# Patient Record
Sex: Male | Born: 1970
Health system: Southern US, Community
[De-identification: ages and names within clinical notes are randomized; demographics above are authoritative.]

## PROBLEM LIST (undated history)

## (undated) DIAGNOSIS — K219 Gastro-esophageal reflux disease without esophagitis: Secondary | ICD-10-CM

## (undated) HISTORY — PX: HERNIA REPAIR: SHX51

## (undated) HISTORY — PX: CHOLECYSTECTOMY: SHX55

## (undated) HISTORY — DX: Gastro-esophageal reflux disease without esophagitis: K21.9

---

## 2002-07-24 ENCOUNTER — Encounter: Payer: Self-pay | Admitting: *Deleted

## 2002-07-24 ENCOUNTER — Encounter (INDEPENDENT_AMBULATORY_CARE_PROVIDER_SITE_OTHER): Payer: Self-pay | Admitting: *Deleted

## 2002-07-24 ENCOUNTER — Observation Stay (HOSPITAL_COMMUNITY): Admission: RE | Admit: 2002-07-24 | Discharge: 2002-07-25 | Payer: Self-pay | Admitting: *Deleted

## 2004-10-26 ENCOUNTER — Ambulatory Visit: Payer: Self-pay | Admitting: Family Medicine

## 2005-09-14 ENCOUNTER — Ambulatory Visit: Payer: Self-pay | Admitting: Family Medicine

## 2010-06-10 ENCOUNTER — Emergency Department (HOSPITAL_COMMUNITY): Admission: EM | Admit: 2010-06-10 | Discharge: 2010-06-10 | Payer: Self-pay | Admitting: Emergency Medicine

## 2011-01-05 LAB — URINALYSIS, ROUTINE W REFLEX MICROSCOPIC
Bilirubin Urine: NEGATIVE
Glucose, UA: NEGATIVE mg/dL
Ketones, ur: NEGATIVE mg/dL
Leukocytes, UA: NEGATIVE
Nitrite: NEGATIVE
Protein, ur: NEGATIVE mg/dL
Specific Gravity, Urine: <1.005 — ABNORMAL LOW (ref 1.005–1.030)
Urobilinogen, UA: 0.2 mg/dL (ref 0.0–1.0)
pH: 6.5 (ref 5.0–8.0)

## 2011-01-05 LAB — URINE CULTURE
Colony Count: NO GROWTH
Culture  Setup Time: 201108202100
Culture: NO GROWTH

## 2011-01-05 LAB — URINE MICROSCOPIC-ADD ON

## 2011-03-09 NOTE — Op Note (Signed)
NAME:  Daniel Ellis, Daniel Ellis                           ACCOUNT NO.:  [id number]   MEDICAL RECORD NO.:  [id number]                   PATIENT TYPE:  AMB   LOCATION:  DAY                                  FACILITY:  [facility]   PHYSICIAN:  Thomas B. Samuella Cota, M.D.               DATE OF BIRTH:  1971-02-13   DATE OF PROCEDURE:  07/24/2002  DATE OF DISCHARGE:                                 OPERATIVE REPORT   OFFICE MEDICAL RECORD NUMBER:  [id number].   PREOPERATIVE DIAGNOSIS:  Biliary dyskinesia.   POSTOPERATIVE DIAGNOSIS:  Biliary dyskinesia.   OPERATION:  Laparoscopic cholecystectomy with operative cholangiogram.   SURGEON:  Maisie Fus B. Samuella Cota, M.D.   ASSISTANT:  Gabrielle Dare. Janee Morn, M.D.   ANESTHESIA:  General.   ANESTHESIOLOGIST:  CRNA.   DESCRIPTION OF PROCEDURE:  The patient was taken to the operating room and  placed on the table in Ellis supine position.  After satisfactory general  anesthetic with intubation, the entire abdomen was prepped and draped as Ellis  sterile field.  Ellis small vertical infraumbilical incision was made through  skin and subcutaneous tissue and midline fascia.  Ellis finger was placed into  the peritoneal cavity, and there were no adhesions.  Ellis pursestring suture of  0 Vicryl was placed, and the Hasson trocar was placed into the abdomen and  the abdomen insufflated to 14 mmHg pressure.  Ellis second 10 mm trocar was  placed just to the right of midline and subxiphoid area.  Two 5 mm trocars  were placed laterally.  Gallbladder was placed on some traction.  There were  adhesions to the gallbladder, extending up about one-third the length of the  gallbladder.  They were taken down with gentle dissection.  The cystic  artery was readily identified and triply clipped on the remaining side, once  on the gallbladder side.  The cystic duct was also readily identified, was  fairly long, and dissected free, and Ellis clip was placed on the gallbladder  side of the cystic duct.  Ellis Cook Cholangiocath  catheter was then introduced  through anterior abdominal wall.  Ellis small opening was made into the cystic  duct, and the Cholangiocath was placed into the cystic duct and held in  place with an Endo Clip.  Using real-time fluoroscopy and the C-arm, Ellis  cholangiogram was carried out.  This revealed good filling of the entire  biliary system with free spillage of the contents into the duodenum.  The  cystic duct seemed to be coming into the junction of the right and left  hepatic ducts.  The operative cholangiogram had been reviewed.  The  Cholangiocath was removed, and the cystic duct was triply clipped on the  remaining side.  The cystic duct was then divided, and the cystic artery was  also then divided.  The gallbladder was then dissected from the bed using  the cautery.  There was another vessel coming off posteriorly which was  doubly clipped.  Gallbladder was freed up and hemostasis obtained.  The  gallbladder was easily removed through the infraumbilical incision.  The  right upper quadrant was irrigated, and there was no evidence of any blood  or bile.  The two lateral trocars were removed under direct vision.  The  pursestring suture at the infraumbilical incision was tied to give Ellis good  closure of the fascia.  It was then injected with 0.25% Marcaine without  epinephrine.  The other sites had previously been injected.  The abdomen was  deflated and the second 10 mm trocar removed.  The two midline incisions  were closed with running subcutaneous  sutures of 40 Vicryl, and the lateral trocar sites were closed with single  simple 4-0 Vicryl subcuticular sutures.  Benzoin and quarter-inch Steri-  Strips were used to reinforce the skin closure.  Dry sterile dressing was  applied.  The patient seemed to tolerate the procedure well and was taken to  the PACU in satisfactory condition.                                               Thomas B. Samuella Cota, M.D.    TBP/MEDQ  D:  07/24/2002  T:   07/24/2002  Job:  [id number]   cc:   Delaney Meigs, M.D.  [address]  [place]  [place] [address]  Fax: [phone]

## 2013-08-11 ENCOUNTER — Encounter (INDEPENDENT_AMBULATORY_CARE_PROVIDER_SITE_OTHER): Payer: Self-pay

## 2013-08-11 ENCOUNTER — Ambulatory Visit (INDEPENDENT_AMBULATORY_CARE_PROVIDER_SITE_OTHER): Payer: BC Managed Care – PPO | Admitting: Family Medicine

## 2013-08-11 VITALS — BP 123/80 | HR 67 | Temp 98.3°F | Wt 237.0 lb

## 2013-08-11 DIAGNOSIS — H6690 Otitis media, unspecified, unspecified ear: Secondary | ICD-10-CM

## 2013-08-11 DIAGNOSIS — H60399 Other infective otitis externa, unspecified ear: Secondary | ICD-10-CM

## 2013-08-11 DIAGNOSIS — H6092 Unspecified otitis externa, left ear: Secondary | ICD-10-CM

## 2013-08-11 MED ORDER — AMOXICILLIN 875 MG PO TABS: 875.0000 mg | ORAL_TABLET | Freq: Two times a day (BID) | ORAL | Status: DC

## 2013-08-11 MED ORDER — NEOMYCIN-POLYMYXIN-HC 3.5-10000-1 OT SOLN: 3.0000 [drp] | Freq: Four times a day (QID) | OTIC | Status: DC

## 2013-08-11 NOTE — Progress Notes (Signed)
  Subjective:    Patient ID: Daniel Ellis, male    DOB: Nov 19, 1970, 42 y.o.   MRN: [id number]  HPI EAR PAIN Location:  L ear  Description: L ear pain, sensation of fullness Onset:  2-3 days  Modifying factors: none   Symptoms  Sensation of fullness: yes  Ear discharge: no URI symptoms: no  Fever: no Tinnitus:no   Dizziness:no   Hearing loss:no   Toothache: no Rashes or lesions: no Facial muscle weakness: no  Red Flags Recent trauma: no PMH prior ear surgery:  Yes. ET tubes as a child  Diabetes or Immunosuppresion: no      Review of Systems  All other systems reviewed and are negative.       Objective:   Physical Exam  Constitutional: He appears well-developed and well-nourished.  HENT:  Head: Normocephalic and atraumatic.  Right Ear: External ear normal.  L ear canal erythema and tenderness to otoscopic evaluaton    Eyes: Conjunctivae are normal. Pupils are equal, round, and reactive to light.  Neck: Normal range of motion.  Cardiovascular: Normal rate and regular rhythm.   Pulmonary/Chest: Effort normal.  Abdominal: Soft.  Musculoskeletal: Normal range of motion.  Neurological: He is alert.  Skin: Skin is warm.          Assessment & Plan:  OE (otitis externa), left - Plan: neomycin-polymyxin-hydrocortisone (CORTISPORIN) otic solution  AOM (acute otitis media), left - Plan: amoxicillin (AMOXIL) 875 MG tablet  WIll treat with cortisporin otic and amox  Discussed infectious and ENT red flags.  Pt also had some issues with ? Sexual hyperexcitability as I was leaving the room. Him and his wife have decreased sexual frequency s/p child. No problems with erections. Discussed increasing sexual frequency, diet and multivitamin use. Follow up if not improved with increased sexual frequency. Start SSRI vs. Tramadol.  Follow up as needed.

## 2013-10-19 ENCOUNTER — Ambulatory Visit (INDEPENDENT_AMBULATORY_CARE_PROVIDER_SITE_OTHER): Payer: BC Managed Care – PPO | Admitting: Family Medicine

## 2013-10-19 ENCOUNTER — Encounter: Payer: Self-pay | Admitting: Family Medicine

## 2013-10-19 ENCOUNTER — Encounter (INDEPENDENT_AMBULATORY_CARE_PROVIDER_SITE_OTHER): Payer: Self-pay

## 2013-10-19 VITALS — BP 111/73 | HR 73 | Temp 98.0°F | Ht 72.0 in | Wt 231.0 lb

## 2013-10-19 DIAGNOSIS — J309 Allergic rhinitis, unspecified: Secondary | ICD-10-CM

## 2013-10-19 MED ORDER — LORATADINE 10 MG PO TABS: 10.0000 mg | ORAL_TABLET | Freq: Every day | ORAL | Status: DC

## 2013-10-19 MED ORDER — FLUTICASONE PROPIONATE 50 MCG/ACT NA SUSP: 2.0000 | Freq: Every day | NASAL | Status: DC

## 2013-10-19 NOTE — Progress Notes (Signed)
   Subjective:    Patient ID: Daniel Ellis, male    DOB: 11-10-1970, 42 y.o.   MRN: [id number]  HPI EAR PAIN Location:  L ear  Description: intermittent ear discomfort, minimal pain  Onset:  2-3 days  Modifying factors: was treated for EO and AOM about 1 month ago. Resolved. Now with sinus pressure. Hx/o sinus pressure/allergic rhinitis partially treated   Symptoms  Sensation of fullness: no Ear discharge: minimal  URI symptoms: mild  Fever: no Tinnitus:no   Dizziness:no   Hearing loss:no   Toothache: no Rashes or lesions: no Facial muscle weakness: no  Red Flags Recent trauma: no PMH prior ear surgery:  ET tubes in childhood.  Diabetes or Immunosuppresion: no       Review of Systems  All other systems reviewed and are negative.       Objective:   Physical Exam  Constitutional: He is oriented to person, place, and time. He appears well-developed and well-nourished.  HENT:  Head: Normocephalic and atraumatic.  Right Ear: External ear normal.  Left Ear: External ear normal.  Eyes: Conjunctivae are normal. Pupils are equal, round, and reactive to light.  Neck: Normal range of motion.  Cardiovascular: Normal rate and regular rhythm.   Pulmonary/Chest: Effort normal and breath sounds normal.  Abdominal: Soft.  Musculoskeletal: Normal range of motion.  Neurological: He is alert and oriented to person, place, and time.  Skin: Skin is warm.          Assessment & Plan:  Allergic rhinitis - Plan: fluticasone (FLONASE) 50 MCG/ACT nasal spray, loratadine (CLARITIN) 10 MG tablet  Exam and history consistent with allergic rhinitis and secondary eustachian tube dysfunction.  Will start pt on flonase and claritin.  Outer/inner ear pathology on exam Discussed general and ENT red flags at length.  Consider ENT follow up if sxs persist despite treatment.  Follow up as needed.

## 2014-03-10 ENCOUNTER — Encounter (INDEPENDENT_AMBULATORY_CARE_PROVIDER_SITE_OTHER): Payer: BC Managed Care – PPO | Admitting: Ophthalmology

## 2014-03-10 DIAGNOSIS — H3400 Transient retinal artery occlusion, unspecified eye: Secondary | ICD-10-CM

## 2014-03-10 DIAGNOSIS — H2510 Age-related nuclear cataract, unspecified eye: Secondary | ICD-10-CM

## 2014-03-10 DIAGNOSIS — H43819 Vitreous degeneration, unspecified eye: Secondary | ICD-10-CM

## 2014-11-29 ENCOUNTER — Ambulatory Visit (INDEPENDENT_AMBULATORY_CARE_PROVIDER_SITE_OTHER): Payer: BLUE CROSS/BLUE SHIELD | Admitting: Family Medicine

## 2014-11-29 ENCOUNTER — Encounter: Payer: Self-pay | Admitting: Family Medicine

## 2014-11-29 VITALS — BP 131/87 | HR 92 | Temp 97.6°F | Ht 72.0 in | Wt 233.0 lb

## 2014-11-29 DIAGNOSIS — Z024 Encounter for examination for driving license: Secondary | ICD-10-CM

## 2014-11-29 NOTE — Progress Notes (Signed)
   Subjective:    Patient ID: Daniel Ellis, male    DOB: 12-24-1970, 44 y.o.   MRN: [id number]  HPI Patient is here for DOT PE  Review of Systems  Constitutional: Negative for fever.  HENT: Negative for ear pain.   Eyes: Negative for discharge.  Respiratory: Negative for cough.   Cardiovascular: Negative for chest pain.  Gastrointestinal: Negative for abdominal distention.  Endocrine: Negative for polyuria.  Genitourinary: Negative for difficulty urinating.  Musculoskeletal: Negative for gait problem and neck pain.  Skin: Negative for color change and rash.  Neurological: Negative for speech difficulty and headaches.  Psychiatric/Behavioral: Negative for agitation.       Objective:    BP 131/87 mmHg  Pulse 92  Temp(Src) 97.6 F (36.4 C) (Oral)  Ht 6' (1.829 m)  Wt 233 lb (105.688 kg)  BMI 31.59 kg/m2 Physical Exam  Constitutional: He is oriented to person, place, and time. He appears well-developed and well-nourished.  HENT:  Head: Normocephalic and atraumatic.  Mouth/Throat: Oropharynx is clear and moist.  Eyes: Pupils are equal, round, and reactive to light.  Neck: Normal range of motion. Neck supple.  Cardiovascular: Normal rate and regular rhythm.   No murmur heard. Pulmonary/Chest: Effort normal and breath sounds normal.  Abdominal: Soft. Bowel sounds are normal. There is no tenderness.  Neurological: He is alert and oriented to person, place, and time.  Skin: Skin is warm and dry.  Psychiatric: He has a normal mood and affect.          Assessment & Plan:     ICD-9-CM ICD-10-CM   1. Encounter for CDL (commercial driving license) exam W29.5V70.5 Z02.1      No Follow-up on file.  Deatra CanterWilliam J Coreon Simkins FNP

## 2015-02-01 ENCOUNTER — Encounter: Payer: Self-pay | Admitting: Nurse Practitioner

## 2015-02-01 ENCOUNTER — Telehealth: Payer: Self-pay | Admitting: Family Medicine

## 2015-02-01 ENCOUNTER — Ambulatory Visit (INDEPENDENT_AMBULATORY_CARE_PROVIDER_SITE_OTHER): Payer: BLUE CROSS/BLUE SHIELD | Admitting: Nurse Practitioner

## 2015-02-01 VITALS — BP 120/82 | HR 92 | Temp 98.0°F | Ht 72.0 in | Wt 229.2 lb

## 2015-02-01 DIAGNOSIS — R197 Diarrhea, unspecified: Secondary | ICD-10-CM

## 2015-02-01 NOTE — Patient Instructions (Signed)

## 2015-02-01 NOTE — Progress Notes (Signed)
  Subjective:     Daniel Ellis is a 44 y.o. male who presents for evaluation of diarrhea 5 times per day. Symptoms have been present for 3 weeks. Patient denies blood in stool and nausea. Patient's oral intake has been normal. He has tried imodium AD OTC which works but as soon as he stops taking diarrhea returns. Says that it always has a greenish color. Patient's urine output has been adequate. Other contacts with similar symptoms include: none. Patient denies recent travel history. Patient has not had recent ingestion of possible contaminated food, toxic plants, or inappropriate medications/poisons.   The following portions of the patient's history were reviewed and updated as appropriate: allergies, current medications, past family history, past medical history, past social history, past surgical history and problem list.  Review of Systems Pertinent items are noted in HPI.    Objective:     BP 120/82 mmHg  Pulse 92  Temp(Src) 98 F (36.7 C) (Oral)  Ht 6' (1.829 m)  Wt 229 lb 3.2 oz (103.964 kg)  BMI 31.08 kg/m2 General appearance: alert, cooperative, appears stated age and no distress Lungs: clear to auscultation bilaterally Heart: regular rate and rhythm, S1, S2 normal, no murmur, click, rub or gallop Abdomen: soft, non-tender; bowel sounds normal; no masses,  no organomegaly    Assessment:   Diarrhea       Plan:    1. Discussed oral rehydration, reintroduction of solid foods, signs of dehydration. 2. Return or go to emergency department if worsening symptoms, blood or bile, signs of dehydration, diarrhea lasting longer than 5 days or any new concerns. Stool specimens given to patient. 3. Follow up in 7 days or sooner as needed.    Orders Placed This Encounter  Procedures  . Cdiff NAA+O+P+Stool Culture    Standing Status: Future     Number of Occurrences:      Standing Expiration Date: 02/01/2016    Mary-Margaret Daphine DeutscherMartin, FNP

## 2015-02-01 NOTE — Telephone Encounter (Signed)
Appointment given for tonight with Paulene FloorMary Martin, FNP.

## 2015-02-04 ENCOUNTER — Other Ambulatory Visit: Payer: BLUE CROSS/BLUE SHIELD

## 2015-02-04 NOTE — Addendum Note (Signed)
Addended by: Orma RenderHODGES, Christain Niznik F on: 02/04/2015 12:00 PM   Modules accepted: Orders

## 2015-02-08 LAB — CDIFF NAA+O+P+STOOL CULTURE
E coli, Shiga toxin Assay: NEGATIVE
Toxigenic C. Difficile by PCR: NEGATIVE

## 2015-05-06 ENCOUNTER — Other Ambulatory Visit: Payer: Self-pay | Admitting: Orthopedic Surgery

## 2017-11-28 ENCOUNTER — Ambulatory Visit (INDEPENDENT_AMBULATORY_CARE_PROVIDER_SITE_OTHER): Payer: BLUE CROSS/BLUE SHIELD | Admitting: Family Medicine

## 2017-11-28 ENCOUNTER — Encounter: Payer: Self-pay | Admitting: Family Medicine

## 2017-11-28 VITALS — BP 112/79 | HR 88 | Temp 99.3°F | Ht 72.0 in | Wt 228.0 lb

## 2017-11-28 DIAGNOSIS — J0100 Acute maxillary sinusitis, unspecified: Secondary | ICD-10-CM

## 2017-11-28 DIAGNOSIS — H66002 Acute suppurative otitis media without spontaneous rupture of ear drum, left ear: Secondary | ICD-10-CM

## 2017-11-28 MED ORDER — FLUTICASONE PROPIONATE 50 MCG/ACT NA SUSP: 1.0000 | Freq: Two times a day (BID) | NASAL | 6 refills | Status: DC | PRN

## 2017-11-28 MED ORDER — AZITHROMYCIN 250 MG PO TABS: ORAL_TABLET | ORAL | 0 refills | Status: DC

## 2017-11-28 NOTE — Progress Notes (Signed)
BP 112/79   Pulse 88   Temp 99.3 F (37.4 C) (Oral)   Ht 6' (1.829 m)   Wt 228 lb (103.4 kg)   BMI 30.92 kg/m    Subjective:    Patient ID: Daniel Ellis, male    DOB: Jul 31, 1971, 47 y.o.   MRN: [id number]  HPI: Daniel Ellis is a 47 y.o. male presenting on 11/28/2017 for Sinus congestion, left ear stopped up, nose bleeding, teeth    HPI Sinus congestion and left ear pain and teeth pain Patient has been having sinus congestion and pain in his face and in his teeth and left ear pressure that is been going on for the past week but really worsened over the past day.  He says is been using Alka-Seltzer over-the-counter stuff that has not been working.  He also says is been having some popping and pressure in his left ear as well.  He denies any fevers or chills or shortness of breath or wheezing  Relevant past medical, surgical, family and social history reviewed and updated as indicated. Interim medical history since our last visit reviewed. Allergies and medications reviewed and updated.  Review of Systems  Constitutional: Negative for chills and fever.  HENT: Positive for congestion, ear pain, postnasal drip, rhinorrhea, sinus pressure and sore throat. Negative for ear discharge, sneezing and voice change.   Eyes: Negative for pain, discharge, redness and visual disturbance.  Respiratory: Positive for cough. Negative for shortness of breath and wheezing.   Cardiovascular: Negative for chest pain and leg swelling.  Musculoskeletal: Negative for gait problem.  Skin: Negative for rash.  All other systems reviewed and are negative.   Per HPI unless specifically indicated above        Objective:    BP 112/79   Pulse 88   Temp 99.3 F (37.4 C) (Oral)   Ht 6' (1.829 m)   Wt 228 lb (103.4 kg)   BMI 30.92 kg/m   Wt Readings from Last 3 Encounters:  11/28/17 228 lb (103.4 kg)  02/01/15 229 lb 3.2 oz (104 kg)  11/29/14 233 lb (105.7 kg)    Physical Exam  Constitutional:  He is oriented to person, place, and time. He appears well-developed and well-nourished. No distress.  HENT:  Right Ear: Tympanic membrane, external ear and ear canal normal.  Left Ear: External ear and ear canal normal. No drainage or swelling. Tympanic membrane is injected and erythematous. Tympanic membrane is not perforated and not retracted. Tympanic membrane mobility is normal. A middle ear effusion is present.  Nose: Mucosal edema and rhinorrhea present. No sinus tenderness. No epistaxis. Right sinus exhibits maxillary sinus tenderness. Right sinus exhibits no frontal sinus tenderness. Left sinus exhibits maxillary sinus tenderness. Left sinus exhibits no frontal sinus tenderness.  Mouth/Throat: Uvula is midline and mucous membranes are normal. Posterior oropharyngeal edema and posterior oropharyngeal erythema present. No oropharyngeal exudate or tonsillar abscesses.  Eyes: Conjunctivae and EOM are normal. Pupils are equal, round, and reactive to light. No scleral icterus.  Neck: Neck supple. No thyromegaly present.  Cardiovascular: Normal rate, regular rhythm, normal heart sounds and intact distal pulses.  No murmur heard. Pulmonary/Chest: Effort normal and breath sounds normal. No respiratory distress. He has no wheezes. He has no rales.  Musculoskeletal: Normal range of motion. He exhibits no edema.  Lymphadenopathy:    He has no cervical adenopathy.  Neurological: He is alert and oriented to person, place, and time. Coordination normal.  Skin: Skin is warm  and dry. No rash noted. He is not diaphoretic.  Psychiatric: He has a normal mood and affect. His behavior is normal.  Nursing note and vitals reviewed.      Assessment & Plan:   Problem List Items Addressed This Visit    None    Visit Diagnoses    Acute non-recurrent maxillary sinusitis    -  Primary   Relevant Medications   azithromycin (ZITHROMAX) 250 MG tablet   fluticasone (FLONASE) 50 MCG/ACT nasal spray   Acute  suppurative otitis media of left ear without spontaneous rupture of tympanic membrane, recurrence not specified       Relevant Medications   azithromycin (ZITHROMAX) 250 MG tablet   fluticasone (FLONASE) 50 MCG/ACT nasal spray       Follow up plan: Return if symptoms worsen or fail to improve.  Counseling provided for all of the vaccine components No orders of the defined types were placed in this encounter.   Arville CareJoshua Jahniah Pallas, MD [facility] 11/28/2017, 10:11 AM

## 2017-12-07 ENCOUNTER — Encounter: Payer: Self-pay | Admitting: Family Medicine

## 2017-12-07 ENCOUNTER — Ambulatory Visit (INDEPENDENT_AMBULATORY_CARE_PROVIDER_SITE_OTHER): Payer: BLUE CROSS/BLUE SHIELD | Admitting: Family Medicine

## 2017-12-07 VITALS — BP 123/81 | HR 74 | Temp 97.7°F | Ht 72.0 in | Wt 229.0 lb

## 2017-12-07 DIAGNOSIS — H66005 Acute suppurative otitis media without spontaneous rupture of ear drum, recurrent, left ear: Secondary | ICD-10-CM | POA: Diagnosis not present

## 2017-12-07 DIAGNOSIS — J0100 Acute maxillary sinusitis, unspecified: Secondary | ICD-10-CM | POA: Diagnosis not present

## 2017-12-07 MED ORDER — AMOXICILLIN-POT CLAVULANATE 875-125 MG PO TABS: 1.0000 | ORAL_TABLET | Freq: Two times a day (BID) | ORAL | 0 refills | Status: DC

## 2017-12-07 MED ORDER — PREDNISONE 20 MG PO TABS: ORAL_TABLET | ORAL | 0 refills | Status: DC

## 2017-12-07 NOTE — Progress Notes (Signed)
BP 123/81   Pulse 74   Temp 97.7 F (36.5 C) (Oral)   Ht 6' (1.829 m)   Wt 229 lb (103.9 kg)   BMI 31.06 kg/m    Subjective:    Patient ID: Daniel Ellis, male    DOB: [birth year], 47 y.o.   MRN: [id number]  HPI: Daniel Frosted A Piasecki is a 47 y.o. male presenting on 12/07/2017 for sinus congestion, blood in nose, left ear stopped up, headac (x 2 weeks, Zpak did not get rid of it)   HPI Congestion and cough and blood in his nose and left ear stopped Patient comes in with continued congestion and cough and blood in his nose and left ear stopped up been going on for the past 2 weeks he took the Z-Pak and has been using the Flonase and it has not improved.  He says previously amoxicillin has worked well for him and he would like to try it with that.  He denies any fevers or chills but does have headaches and especially pain extending from his left ear and then also sinus pressure in his face as well.  He denies any sick contacts that he knows of.  He is personally not a smoker but he is around his wife who smokes.  Relevant past medical, surgical, family and social history reviewed and updated as indicated. Interim medical history since our last visit reviewed. Allergies and medications reviewed and updated.  Review of Systems  Constitutional: Negative for chills and fever.  HENT: Positive for congestion, ear pain, postnasal drip, rhinorrhea, sinus pressure, sneezing and sore throat. Negative for ear discharge and voice change.   Eyes: Negative for pain, discharge, redness and visual disturbance.  Respiratory: Positive for cough. Negative for shortness of breath and wheezing.   Cardiovascular: Negative for chest pain and leg swelling.  Musculoskeletal: Negative for gait problem.  Skin: Negative for rash.  All other systems reviewed and are negative.   Per HPI unless specifically indicated above        Objective:    BP 123/81   Pulse 74   Temp 97.7 F (36.5 C) (Oral)   Ht 6' (1.829  m)   Wt 229 lb (103.9 kg)   BMI 31.06 kg/m   Wt Readings from Last 3 Encounters:  12/07/17 229 lb (103.9 kg)  11/28/17 228 lb (103.4 kg)  02/01/15 229 lb 3.2 oz (104 kg)    Physical Exam  Constitutional: He is oriented to person, place, and time. He appears well-developed and well-nourished. No distress.  HENT:  Right Ear: Tympanic membrane, external ear and ear canal normal.  Left Ear: Ear canal normal. No drainage or swelling. Tympanic membrane is injected, erythematous and bulging.  Nose: Mucosal edema and rhinorrhea present. No sinus tenderness. No epistaxis. Right sinus exhibits maxillary sinus tenderness. Right sinus exhibits no frontal sinus tenderness. Left sinus exhibits maxillary sinus tenderness. Left sinus exhibits no frontal sinus tenderness.  Mouth/Throat: Uvula is midline and mucous membranes are normal. Posterior oropharyngeal edema and posterior oropharyngeal erythema present. No oropharyngeal exudate or tonsillar abscesses.  Eyes: Conjunctivae and EOM are normal. Pupils are equal, round, and reactive to light. No scleral icterus.  Neck: Neck supple. No thyromegaly present.  Cardiovascular: Normal rate, regular rhythm, normal heart sounds and intact distal pulses.  No murmur heard. Pulmonary/Chest: Effort normal and breath sounds normal. No respiratory distress. He has no wheezes. He has no rales.  Lymphadenopathy:    He has no cervical adenopathy.  Neurological:  He is alert and oriented to person, place, and time. Coordination normal.  Skin: Skin is warm and dry. No rash noted. He is not diaphoretic.  Psychiatric: He has a normal mood and affect. His behavior is normal.  Nursing note and vitals reviewed.       Assessment & Plan:   Problem List Items Addressed This Visit    None    Visit Diagnoses    Recurrent acute suppurative otitis media without spontaneous rupture of left tympanic membrane    -  Primary   Relevant Medications   amoxicillin-clavulanate  (AUGMENTIN) 875-125 MG tablet   predniSONE (DELTASONE) 20 MG tablet   Acute non-recurrent maxillary sinusitis       Relevant Medications   amoxicillin-clavulanate (AUGMENTIN) 875-125 MG tablet   predniSONE (DELTASONE) 20 MG tablet       Follow up plan: Return if symptoms worsen or fail to improve.  Counseling provided for all of the vaccine components No orders of the defined types were placed in this encounter.   Arville Care, MD [facility] 12/07/2017, 8:14 AM

## 2018-01-10 ENCOUNTER — Encounter: Payer: Self-pay | Admitting: Family

## 2018-01-10 ENCOUNTER — Ambulatory Visit (INDEPENDENT_AMBULATORY_CARE_PROVIDER_SITE_OTHER): Payer: BLUE CROSS/BLUE SHIELD

## 2018-01-10 ENCOUNTER — Ambulatory Visit (INDEPENDENT_AMBULATORY_CARE_PROVIDER_SITE_OTHER): Payer: BLUE CROSS/BLUE SHIELD | Admitting: Family

## 2018-01-10 VITALS — BP 124/77 | HR 69 | Temp 97.6°F | Ht 72.0 in | Wt 226.0 lb

## 2018-01-10 DIAGNOSIS — J189 Pneumonia, unspecified organism: Secondary | ICD-10-CM

## 2018-01-10 DIAGNOSIS — R062 Wheezing: Secondary | ICD-10-CM | POA: Diagnosis not present

## 2018-01-10 DIAGNOSIS — J101 Influenza due to other identified influenza virus with other respiratory manifestations: Secondary | ICD-10-CM

## 2018-01-10 DIAGNOSIS — R0602 Shortness of breath: Secondary | ICD-10-CM

## 2018-01-10 MED ORDER — ALBUTEROL SULFATE HFA 108 (90 BASE) MCG/ACT IN AERS: 2.0000 | INHALATION_SPRAY | Freq: Four times a day (QID) | RESPIRATORY_TRACT | 2 refills | Status: DC | PRN

## 2018-01-10 MED ORDER — LEVOFLOXACIN 500 MG PO TABS: 500.0000 mg | ORAL_TABLET | Freq: Every day | ORAL | 0 refills | Status: DC

## 2018-01-10 MED ORDER — PREDNISONE 10 MG (21) PO TBPK: ORAL_TABLET | ORAL | 0 refills | Status: DC

## 2018-01-10 NOTE — Progress Notes (Addendum)
   Subjective:    Patient ID: Daniel Ellis, male    DOB: 04-15-1971, 47 y.o.   MRN: [id number]  Pt presents to the office today for wheezing and coughing. PT was seen at the Urgent Care in diagnosed with Influenza A and bronchitis. Pt did not take Tamiflu, but was started on doxcycline. Pt reports still feeling "horrlble and wheezing".  Cough  This is a new problem. The current episode started in the past 7 days. The problem has been waxing and waning. The problem occurs every few minutes. The cough is productive of sputum. Associated symptoms include myalgias, nasal congestion, postnasal drip, shortness of breath and wheezing. Pertinent negatives include no chills (did earlier, but improved), ear congestion, ear pain, fever, headaches or sore throat. The symptoms are aggravated by lying down. He has tried rest for the symptoms. The treatment provided mild relief.      Review of Systems  Constitutional: Negative for chills (did earlier, but improved) and fever.  HENT: Positive for postnasal drip. Negative for ear pain and sore throat.   Respiratory: Positive for cough, shortness of breath and wheezing.   Musculoskeletal: Positive for myalgias.  Neurological: Negative for headaches.  All other systems reviewed and are negative.      Objective:   Physical Exam  Constitutional: He is oriented to person, place, and time. He appears well-developed and well-nourished. No distress.  HENT:  Head: Normocephalic.  Right Ear: External ear normal.  Left Ear: External ear normal.  Mouth/Throat: Oropharynx is clear and moist.  Eyes: Pupils are equal, round, and reactive to light. Right eye exhibits no discharge. Left eye exhibits no discharge.  Neck: Normal range of motion. Neck supple. No thyromegaly present.  Cardiovascular: Normal rate, regular rhythm, normal heart sounds and intact distal pulses.  No murmur heard. Pulmonary/Chest: Effort normal. No respiratory distress. He has wheezes. He has  rhonchi.  Abdominal: Soft. Bowel sounds are normal. He exhibits no distension. There is no tenderness.  Musculoskeletal: Normal range of motion. He exhibits no edema or tenderness.  Neurological: He is alert and oriented to person, place, and time.  Skin: Skin is warm and dry. No rash noted. No erythema.  Psychiatric: He has a normal mood and affect. His behavior is normal. Judgment and thought content normal.  Vitals reviewed.  Chest x-ray- Left lobe pneumonia, Preliminary reading by Jannifer Rodneyhristy Riot Barrick, FNP [facility]    BP 124/77   Pulse 69   Temp 97.6 F (36.4 C) (Oral)   Ht 6' (1.829 m)   Wt 226 lb (102.5 kg)   BMI 30.65 kg/m      Assessment & Plan:  1. Community acquired pneumonia, unspecified laterality Stop doxycycline  Start Levaquin and Prednisone Rest Force fluids RTO if symptoms do not improve or worsen  - levofloxacin (LEVAQUIN) 500 MG tablet; Take 1 tablet (500 mg total) by mouth daily.  Dispense: 7 tablet; Refill: 0 - predniSONE (STERAPRED UNI-PAK 21 TAB) 10 MG (21) TBPK tablet; Use as directed  Dispense: 21 tablet; Refill: 0 - DG Chest 2 View; Future  2. Influenza A - DG Chest 2 View; Future  3. SOB (shortness of breath) - DG Chest 2 View; Future  4. Wheezing - DG Chest 2 View; Future   Jannifer Rodneyhristy Keyanah Kozicki, FNP

## 2018-01-10 NOTE — Addendum Note (Signed)
Addended by: Jannifer RodneyHAWKS, Ameya Kutz A on: 01/10/2018 11:41 AM   Modules accepted: Orders

## 2018-01-10 NOTE — Patient Instructions (Signed)

## 2018-01-13 ENCOUNTER — Telehealth: Payer: Self-pay | Admitting: Family

## 2018-01-13 NOTE — Telephone Encounter (Signed)
Note ready for pick up 

## 2018-01-14 ENCOUNTER — Telehealth: Payer: Self-pay | Admitting: *Deleted

## 2018-01-14 NOTE — Telephone Encounter (Signed)
Called patient to let him know his note was ready for pick up and he said he did not go in to work today because he was still not feeling well.  He plans to go in tomorrow if he can.  Can we include today in his work note?

## 2018-01-14 NOTE — Telephone Encounter (Signed)
Aware, note is ready. 

## 2018-02-10 ENCOUNTER — Ambulatory Visit (INDEPENDENT_AMBULATORY_CARE_PROVIDER_SITE_OTHER): Payer: BLUE CROSS/BLUE SHIELD

## 2018-02-10 ENCOUNTER — Encounter: Payer: Self-pay | Admitting: Family

## 2018-02-10 ENCOUNTER — Ambulatory Visit (INDEPENDENT_AMBULATORY_CARE_PROVIDER_SITE_OTHER): Payer: BLUE CROSS/BLUE SHIELD | Admitting: Family

## 2018-02-10 VITALS — BP 114/73 | HR 87 | Temp 99.0°F | Ht 72.0 in | Wt 234.0 lb

## 2018-02-10 DIAGNOSIS — H66005 Acute suppurative otitis media without spontaneous rupture of ear drum, recurrent, left ear: Secondary | ICD-10-CM | POA: Diagnosis not present

## 2018-02-10 DIAGNOSIS — J189 Pneumonia, unspecified organism: Secondary | ICD-10-CM

## 2018-02-10 DIAGNOSIS — J181 Lobar pneumonia, unspecified organism: Secondary | ICD-10-CM

## 2018-02-10 MED ORDER — PREDNISONE 20 MG PO TABS: ORAL_TABLET | ORAL | 0 refills | Status: DC

## 2018-02-10 MED ORDER — DOXYCYCLINE HYCLATE 100 MG PO TABS: 100.0000 mg | ORAL_TABLET | Freq: Two times a day (BID) | ORAL | 0 refills | Status: DC

## 2018-02-10 NOTE — Progress Notes (Signed)
   Subjective:    Patient ID: Daniel Ellis, male    DOB: 12/22/1970, 47 y.o.   MRN: [id number]  Cough  This is a recurrent problem. The current episode started 1 to 4 weeks ago. The problem has been waxing and waning. The problem occurs every few minutes. The cough is productive of sputum. Associated symptoms include chills, a fever, myalgias, nasal congestion, postnasal drip, rhinorrhea, shortness of breath and wheezing. The symptoms are aggravated by lying down and pollens. He has tried rest and prescription cough suppressant (levaquin and prednsione ) for the symptoms. The treatment provided mild relief.      Review of Systems  Constitutional: Positive for chills and fever.  HENT: Positive for postnasal drip and rhinorrhea.   Respiratory: Positive for cough, shortness of breath and wheezing.   Musculoskeletal: Positive for myalgias.  All other systems reviewed and are negative.      Objective:   Physical Exam  Constitutional: He is oriented to person, place, and time. He appears well-developed and well-nourished. No distress.  HENT:  Head: Normocephalic.  Right Ear: External ear normal.  Left Ear: External ear normal. Tympanic membrane is erythematous.  Nose: Mucosal edema and rhinorrhea present.  Mouth/Throat: Posterior oropharyngeal erythema present.  Eyes: Pupils are equal, round, and reactive to light. Right eye exhibits no discharge. Left eye exhibits no discharge.  Neck: Normal range of motion. Neck supple. No thyromegaly present.  Cardiovascular: Normal rate, regular rhythm, normal heart sounds and intact distal pulses.  No murmur heard. Pulmonary/Chest: Effort normal. No respiratory distress. He has wheezes.  Abdominal: Soft. Bowel sounds are normal. He exhibits no distension. There is no tenderness.  Musculoskeletal: Normal range of motion. He exhibits no edema or tenderness.  Neurological: He is alert and oriented to person, place, and time.  Skin: Skin is warm and  dry. No rash noted. No erythema.  Psychiatric: He has a normal mood and affect. His behavior is normal. Judgment and thought content normal.  Vitals reviewed.     BP 114/73   Pulse 87   Temp 99 F (37.2 C) (Oral)   Ht 6' (1.829 m)   Wt 234 lb (106.1 kg)   BMI 31.74 kg/m      Assessment & Plan:  1. Community acquired pneumonia of left upper lobe of lung (HCC) Rest Start doxycycline today Force fluids RTO in 4 weeks - doxycycline (VIBRA-TABS) 100 MG tablet; Take 1 tablet (100 mg total) by mouth 2 (two) times daily.  Dispense: 20 tablet; Refill: 0 - predniSONE (DELTASONE) 20 MG tablet; Take 3 tabs daily for 1 week, then 2 tabs for 1 week, then 1 tab for one week  Dispense: 42 tablet; Refill: 0 - DG Chest 2 View; Future  2. Recurrent acute suppurative otitis media without spontaneous rupture of left tympanic membrane Continue flonase and start zyrtec - doxycycline (VIBRA-TABS) 100 MG tablet; Take 1 tablet (100 mg total) by mouth 2 (two) times daily.  Dispense: 20 tablet; Refill: 0    Jannifer Rodneyhristy Tyesha Joffe, FNP

## 2018-02-10 NOTE — Patient Instructions (Signed)

## 2018-03-10 ENCOUNTER — Ambulatory Visit (INDEPENDENT_AMBULATORY_CARE_PROVIDER_SITE_OTHER): Payer: BLUE CROSS/BLUE SHIELD

## 2018-03-10 ENCOUNTER — Encounter: Payer: Self-pay | Admitting: Family

## 2018-03-10 ENCOUNTER — Ambulatory Visit (INDEPENDENT_AMBULATORY_CARE_PROVIDER_SITE_OTHER): Payer: BLUE CROSS/BLUE SHIELD | Admitting: Family

## 2018-03-10 VITALS — BP 125/83 | HR 92 | Temp 101.1°F | Ht 72.0 in | Wt 231.4 lb

## 2018-03-10 DIAGNOSIS — J189 Pneumonia, unspecified organism: Secondary | ICD-10-CM

## 2018-03-10 MED ORDER — LEVOFLOXACIN 750 MG PO TABS: 750.0000 mg | ORAL_TABLET | Freq: Every day | ORAL | 0 refills | Status: DC

## 2018-03-10 NOTE — Patient Instructions (Signed)

## 2018-03-10 NOTE — Progress Notes (Signed)
Subjective:    Patient ID: Daniel Ellis, male    DOB: 1970/11/21, 47 y.o.   MRN: [id number]  Chief Complaint  Patient presents with  . pneumonia recheck    Cough  This is a recurrent problem. The current episode started more than 1 month ago. The problem has been unchanged. The problem occurs every few minutes. The cough is productive of purulent sputum. Associated symptoms include a fever. Pertinent negatives include no chills, ear congestion, ear pain, headaches, myalgias, sore throat or shortness of breath. He has tried rest and oral steroids for the symptoms. The treatment provided moderate relief. There is no history of asthma or COPD.      Review of Systems  Constitutional: Positive for fever. Negative for chills.  HENT: Negative for ear pain and sore throat.   Respiratory: Positive for cough. Negative for shortness of breath.   Musculoskeletal: Negative for myalgias.  Neurological: Negative for headaches.  All other systems reviewed and are negative.      Objective:   Physical Exam  Constitutional: He is oriented to person, place, and time. He appears well-developed and well-nourished. No distress.  HENT:  Head: Normocephalic.  Right Ear: External ear normal.  Left Ear: External ear normal.  Mouth/Throat: Oropharynx is clear and moist.  Eyes: Pupils are equal, round, and reactive to light. Right eye exhibits no discharge. Left eye exhibits no discharge.  Neck: Normal range of motion. Neck supple. No thyromegaly present.  Cardiovascular: Normal rate, regular rhythm, normal heart sounds and intact distal pulses.  No murmur heard. Pulmonary/Chest: Effort normal and breath sounds normal. No respiratory distress. He has no wheezes.  Abdominal: Soft. Bowel sounds are normal. He exhibits no distension. There is no tenderness.  Musculoskeletal: Normal range of motion. He exhibits no edema or tenderness.  Neurological: He is alert and oriented to person, place, and time. He  has normal reflexes. No cranial nerve deficit.  Skin: Skin is warm and dry. No rash noted. No erythema.  Psychiatric: He has a normal mood and affect. His behavior is normal. Judgment and thought content normal.  Vitals reviewed.     BP 125/83   Pulse 92   Temp (!) 101.1 F (38.4 C) (Oral)   Ht 6' (1.829 m)   Wt 231 lb 6.4 oz (105 kg)   BMI 31.38 kg/m      Assessment & Plan:  Daniel Ellis was seen today for pneumonia recheck.  Diagnoses and all orders for this visit:  Community acquired pneumonia, unspecified laterality -     DG Chest 2 View; Future -     CT Chest W Contrast; Future -     levofloxacin (LEVAQUIN) 750 MG tablet; Take 1 tablet (750 mg total) by mouth daily. -     CBC with Differential/Platelet -     HIV antibody -     Ambulatory referral to Pulmonology  Recurrent pneumonia -     CT Chest W Contrast; Future -     levofloxacin (LEVAQUIN) 750 MG tablet; Take 1 tablet (750 mg total) by mouth daily. -     CBC with Differential/Platelet -     HIV antibody -     Ambulatory referral to Pulmonology    Worrisome for some underlying process causing this pneumonia since it has not resolved Will order CT scan and do referral to Pulmonologist Red flags discussed and told if symptoms worsen to go to ED Levaquin ordered and told to start today  Jannifer Rodney, FNP

## 2018-03-11 ENCOUNTER — Telehealth: Payer: Self-pay | Admitting: Family

## 2018-03-11 ENCOUNTER — Other Ambulatory Visit: Payer: Self-pay | Admitting: Family

## 2018-03-11 ENCOUNTER — Ambulatory Visit (HOSPITAL_COMMUNITY)
Admission: RE | Admit: 2018-03-11 | Discharge: 2018-03-11 | Disposition: A | Discharge status: Home / Self Care | Payer: BLUE CROSS/BLUE SHIELD | Source: Ambulatory Visit | Attending: Family | Admitting: Family

## 2018-03-11 DIAGNOSIS — R918 Other nonspecific abnormal finding of lung field: Secondary | ICD-10-CM | POA: Diagnosis not present

## 2018-03-11 DIAGNOSIS — R590 Localized enlarged lymph nodes: Secondary | ICD-10-CM | POA: Diagnosis not present

## 2018-03-11 DIAGNOSIS — R7989 Other specified abnormal findings of blood chemistry: Secondary | ICD-10-CM

## 2018-03-11 DIAGNOSIS — K449 Diaphragmatic hernia without obstruction or gangrene: Secondary | ICD-10-CM | POA: Diagnosis not present

## 2018-03-11 DIAGNOSIS — R0602 Shortness of breath: Secondary | ICD-10-CM | POA: Diagnosis present

## 2018-03-11 DIAGNOSIS — R509 Fever, unspecified: Secondary | ICD-10-CM | POA: Diagnosis not present

## 2018-03-11 DIAGNOSIS — J189 Pneumonia, unspecified organism: Secondary | ICD-10-CM

## 2018-03-11 LAB — BMP8+EGFR
BUN/Creatinine Ratio: 8 — ABNORMAL LOW (ref 9–20)
BUN: 14 mg/dL (ref 6–24)
CO2: 24 mmol/L (ref 20–29)
Calcium: 9.2 mg/dL (ref 8.7–10.2)
Chloride: 98 mmol/L (ref 96–106)
Creatinine, Ser: 1.74 mg/dL — ABNORMAL HIGH (ref 0.76–1.27)
GFR calc Af Amer: 52 mL/min/{1.73_m2} — ABNORMAL LOW (ref >59)
GFR calc non Af Amer: 45 mL/min/{1.73_m2} — ABNORMAL LOW (ref >59)
Glucose: 96 mg/dL (ref 65–99)
Potassium: 4.6 mmol/L (ref 3.5–5.2)
Sodium: 137 mmol/L (ref 134–144)

## 2018-03-11 LAB — CBC WITH DIFFERENTIAL/PLATELET
Basophils Absolute: 0 10*3/uL (ref 0.0–0.2)
Basos: 0 %
EOS (ABSOLUTE): 0.1 10*3/uL (ref 0.0–0.4)
Eos: 1 %
Hematocrit: 36 % — ABNORMAL LOW (ref 37.5–51.0)
Hemoglobin: 12 g/dL — ABNORMAL LOW (ref 13.0–17.7)
Immature Grans (Abs): 0 10*3/uL (ref 0.0–0.1)
Immature Granulocytes: 0 %
Lymphocytes Absolute: 0.8 10*3/uL (ref 0.7–3.1)
Lymphs: 7 %
MCH: 31.6 pg (ref 26.6–33.0)
MCHC: 33.3 g/dL (ref 31.5–35.7)
MCV: 95 fL (ref 79–97)
Monocytes Absolute: 0.8 10*3/uL (ref 0.1–0.9)
Monocytes: 7 %
Neutrophils Absolute: 8.9 10*3/uL — ABNORMAL HIGH (ref 1.4–7.0)
Neutrophils: 85 %
Platelets: 184 10*3/uL (ref 150–450)
RBC: 3.8 x10E6/uL — ABNORMAL LOW (ref 4.14–5.80)
RDW: 15.7 % — ABNORMAL HIGH (ref 12.3–15.4)
WBC: 10.6 10*3/uL (ref 3.4–10.8)

## 2018-03-11 LAB — HIV ANTIBODY (ROUTINE TESTING W REFLEX): HIV Screen 4th Generation wRfx: NONREACTIVE

## 2018-03-11 MED ORDER — IOPAMIDOL (ISOVUE-300) INJECTION 61%
100.0000 mL | Freq: Once | INTRAVENOUS | Status: AC | PRN
  Administered 2018-03-11: 60 mL via INTRAVENOUS

## 2018-03-11 NOTE — Telephone Encounter (Signed)
Patient aware of results.

## 2018-03-11 NOTE — Telephone Encounter (Signed)
Pt notified of results

## 2018-03-20 ENCOUNTER — Other Ambulatory Visit: Payer: Self-pay | Admitting: Family

## 2018-04-08 ENCOUNTER — Encounter: Payer: Self-pay | Admitting: Internal Medicine

## 2018-04-08 ENCOUNTER — Ambulatory Visit (INDEPENDENT_AMBULATORY_CARE_PROVIDER_SITE_OTHER): Payer: BLUE CROSS/BLUE SHIELD | Admitting: Internal Medicine

## 2018-04-08 ENCOUNTER — Ambulatory Visit (INDEPENDENT_AMBULATORY_CARE_PROVIDER_SITE_OTHER)
Admission: RE | Admit: 2018-04-08 | Discharge: 2018-04-08 | Disposition: A | Discharge status: Home / Self Care | Payer: BLUE CROSS/BLUE SHIELD | Source: Ambulatory Visit | Attending: Internal Medicine | Admitting: Internal Medicine

## 2018-04-08 ENCOUNTER — Other Ambulatory Visit (INDEPENDENT_AMBULATORY_CARE_PROVIDER_SITE_OTHER): Payer: BLUE CROSS/BLUE SHIELD

## 2018-04-08 VITALS — BP 112/80 | HR 78 | Ht 72.0 in | Wt 232.0 lb

## 2018-04-08 DIAGNOSIS — R918 Other nonspecific abnormal finding of lung field: Secondary | ICD-10-CM

## 2018-04-08 DIAGNOSIS — R05 Cough: Secondary | ICD-10-CM | POA: Diagnosis not present

## 2018-04-08 DIAGNOSIS — R059 Cough, unspecified: Secondary | ICD-10-CM

## 2018-04-08 DIAGNOSIS — R058 Other specified cough: Secondary | ICD-10-CM | POA: Insufficient documentation

## 2018-04-08 LAB — CBC WITH DIFFERENTIAL/PLATELET
Basophils Absolute: 0.1 10*3/uL (ref 0.0–0.1)
Basophils Relative: 1.1 % (ref 0.0–3.0)
Eosinophils Absolute: 0.4 10*3/uL (ref 0.0–0.7)
Eosinophils Relative: 7.6 % — ABNORMAL HIGH (ref 0.0–5.0)
HCT: 38 % — ABNORMAL LOW (ref 39.0–52.0)
Hemoglobin: 13 g/dL (ref 13.0–17.0)
Lymphocytes Relative: 21.4 % (ref 12.0–46.0)
Lymphs Abs: 1.1 10*3/uL (ref 0.7–4.0)
MCHC: 34.2 g/dL (ref 30.0–36.0)
MCV: 94.6 fl (ref 78.0–100.0)
Monocytes Absolute: 0.4 10*3/uL (ref 0.1–1.0)
Monocytes Relative: 8.3 % (ref 3.0–12.0)
Neutro Abs: 3.1 10*3/uL (ref 1.4–7.7)
Neutrophils Relative %: 61.6 % (ref 43.0–77.0)
Platelets: 171 10*3/uL (ref 150.0–400.0)
RBC: 4.02 Mil/uL — ABNORMAL LOW (ref 4.22–5.81)
RDW: 16.2 % — ABNORMAL HIGH (ref 11.5–15.5)
WBC: 5.1 10*3/uL (ref 4.0–10.5)

## 2018-04-08 LAB — SEDIMENTATION RATE: Sed Rate: 29 mm/hr — ABNORMAL HIGH (ref 0–15)

## 2018-04-08 NOTE — Assessment & Plan Note (Addendum)
Onset of symptoms mid march 2019 with CT 03/11/18 c/w migrating infiltrates/ non-specific adenopathy   Miscellaneous:Alv microlithiasis, alv proteinosis, asp, bronchiectais, BOOP :  ESR 04/08/2018 = 29  ARDS/ AIP Occupational dz/ HSP> no risk factors  Neoplasm Infection Drug > none identified  Pulmonary emboli, Protein disorders Edema/Eosinophilic dz  - note Eos 0.4 04/08/2018 / IgE pending  Sarcoidosis :  ACE level 04/08/2018 = pending  Connective tissue dz Hist X / Hemorrhage Idiopathic    The migratory nature of these changes strongly suggests a benign etiology either partially treated boop or eos lung dz or sarcoid - also possible he has chronic sinusitis with intermittent asp of purulent secretions into the lower airway nocturnally as the most worrisome persistent as dz is in dependent portion of LUL so will proceed with sinus CT and await above studies then consider fob next.   Discussed in detail all the  indications, usual  risks and alternatives  relative to the benefits with patient who agrees to proceed with w/u as outlined.      Total time devoted to counseling  > 50 % of initial 60 min office visit:  review case with pt/ discussion of options/alternatives/ personally creating written customized instructions  in presence of pt  then going over those specific  Instructions directly with the pt including how to use all of the meds but in particular covering each new medication in detail and the difference between the maintenance= "automatic" meds and the prns using an action plan format for the latter (If this problem/symptom => do that organization reading Left to right).  Please see AVS from this visit for a full list of these instructions which I personally wrote for this pt and  are unique to this visit.

## 2018-04-08 NOTE — Progress Notes (Signed)
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 Pulmonary Consult    Referred by Jannifer Rodney, NP. Pt has been dxed with PNA x 3 since mid March 2019.  He c/o non prod cough, mainly at night when he lies down.   fever, aches gone, cough is better except noct    p several hours most nights wakes up coughing but only transient/ not aware of any sinus symptoms other than sense of pnds but never produces any mucus, no nasal obst symptoms and  no longer sensing need for  flonase    Takes nexium after supper daily /  no arthralgias     No obvious day to day or daytime variability or assoc excess/ purulent sputum or mucus plugs or hemoptysis or cp or chest tightness, subjective wheeze or overt  hb symptoms. No unusual exposure hx or h/o childhood pna/ asthma or knowledge of premature birth.  Sleeping ok flat without nocturnal  or early am exacerbation  of respiratory  c/o's or need for noct saba. Also denies any obvious fluctuation of symptoms with weather or environmental changes or other aggravating or alleviating factors except as outlined above   Current Allergies, Complete Past Medical History, Past Surgical History, Family History, and Social History were reviewed in [employer] record.  ROS  The following  are not active complaints unless bolded Hoarseness, sore throat, dysphagia, dental problems, itching, sneezing,  nasal congestion or discharge of excess mucus or purulent secretions, ear ache,   fever, chills, sweats, unintended wt loss or wt gain, classically pleuritic or exertional cp,  orthopnea pnd or leg swelling, presyncope, palpitations, abdominal pain, anorexia, nausea, vomiting, diarrhea  or change in bowel habits or change in bladder habits, change in stools or change in urine, dysuria, hematuria,  rash, arthralgias, visual complaints, headache, numbness, weakness or ataxia or problems with walking or coordination,  change in mood/affect or memory.        Current Meds  Medication Sig  . albuterol (PROVENTIL HFA;VENTOLIN HFA) 108 (90 Base) MCG/ACT inhaler Inhale 2 puffs into the lungs every 6 (six) hours as needed for wheezing or shortness of breath.  . esomeprazole (NEXIUM) 40 MG capsule Take 40 mg by mouth daily at 12 noon.  . fluticasone (FLONASE) 50 MCG/ACT nasal spray Place 1 spray into both nostrils 2 (two) times daily as needed for allergies or rhinitis.  . Multiple Vitamins-Minerals (ONE-A-DAY MENS HEALTH FORMULA PO) Take 1 capsule by mouth daily.       Review of Systems     Objective:   Physical Exam    amb wm nad    Wt Readings from Last 3 Encounters:  04/08/18 232 lb (105.2 kg)  03/10/18 231 lb 6.4 oz (105 kg)  02/10/18  234 lb (106.1 kg)     Vital signs reviewed - Note on arrival 02 sats  98% on RA       HEENT: nl dentition,  and oropharynx. Nl external ear canals without cough reflex - moderate bilateral non-specific turbinate edema     NECK :  without JVD/Nodes/TM/ nl carotid upstrokes bilaterally   LUNGS: no acc muscle use,  Nl contour chest which is clear to A and P bilaterally without cough on insp or exp maneuvers   CV:  RRR  no s3 or murmur or increase in P2, and no edema   ABD:  soft and nontender with nl inspiratory excursion in the supine  position. No bruits or organomegaly appreciated, bowel sounds nl  MS:  Nl gait/ ext warm without deformities, calf tenderness, cyanosis or clubbing No obvious joint restrictions   SKIN: warm and dry with purplish patches R temple, scattered over arms  Bilaterally s extensor distribution    NEURO:  alert, approp, nl sensorium with  no motor or cerebellar deficits apparent.      CXR PA and Lateral:   04/08/2018 :    I personally reviewed images and agree with radiology impression as follows:    No significant change in LEFT UPPER lobe consolidation/airspace disease. Continued follow-up to resolution recommended.   Labs ordered/ reviewed:      Chemistry      Component Value Date/Time   NA 137 03/10/2018 1647   K 4.6 03/10/2018 1647   CL 98 03/10/2018 1647   CO2 24 03/10/2018 1647   BUN 14 03/10/2018 1647   CREATININE 1.74 (H) 03/10/2018 1647      Component Value Date/Time   CALCIUM 9.2 03/10/2018 1647        Lab Results  Component Value Date   WBC 5.1 04/08/2018   HGB 13.0 04/08/2018   HCT 38.0 (L) 04/08/2018   MCV 94.6 04/08/2018   PLT 171.0 04/08/2018       E0S                                                                0.4                                    04/08/2018        Lab Results  Component Value Date   ESRSEDRATE 29 (H) 04/08/2018          Assessment:

## 2018-04-08 NOTE — Assessment & Plan Note (Addendum)
Allergy profile 04/08/2018 >  Eos 0.4 /  IgE   Sinus CT 04/08/2018 >>>   Has h/o "bad sinus infections" x years and persisent noct cough now so hold any further empirical rx and w/u with sinus CT and allergy profile

## 2018-04-08 NOTE — Patient Instructions (Addendum)
Change nexium to where you take Take 30-60 min before last meal of the day    GERD (REFLUX)  is an extremely common cause of respiratory symptoms just like yours , many times with no obvious heartburn at all.    It can be treated with medication, but also with lifestyle changes including elevation of the head of your bed (ideally with 6 inch  bed blocks),  Smoking cessation, avoidance of late meals, excessive alcohol, and avoid fatty foods, chocolate, peppermint, colas, red wine, and acidic juices such as orange juice.  NO MINT OR MENTHOL PRODUCTS SO NO COUGH DROPS  USE SUGARLESS CANDY INSTEAD (Jolley ranchers or Stover's or Life Savers) or even ice chips will also do - the key is to swallow to prevent all throat clearing. NO OIL BASED VITAMINS - use powdered substitutes.    Please see patient coordinator before you leave today  to schedule sinus CT   Please remember to go to the lab and x-ray department downstairs in the basement  for your tests - we will call you with the results when they are available.     Pulmonary follow up will be arranged based on the above findings

## 2018-04-09 ENCOUNTER — Telehealth: Payer: Self-pay | Admitting: Internal Medicine

## 2018-04-09 ENCOUNTER — Encounter: Payer: Self-pay | Admitting: Internal Medicine

## 2018-04-09 ENCOUNTER — Other Ambulatory Visit: Payer: Self-pay | Admitting: Internal Medicine

## 2018-04-09 MED ORDER — PREDNISONE 10 MG PO TABS: ORAL_TABLET | ORAL | 0 refills | Status: DC

## 2018-04-09 NOTE — Progress Notes (Signed)
Discussed findings of migratory pna/ ? boop  rec Prednisone 10 mg take  4 each am x 2 days,   2 each am x 2 days,  1 each am x 2 days and stop and f/u cxr and if improving then follow, if not then bx

## 2018-04-09 NOTE — Telephone Encounter (Signed)
Called and spoke with pt stating to him MW has not reviewed the results yet but once the results have been reviewed, we would call him with the results.  Pt expressed understanding.  Dr. Sherene SiresWert, please advise on the results of pt's labwork and xray from 04/08/18.  Thanks!

## 2018-04-10 ENCOUNTER — Other Ambulatory Visit: Payer: Self-pay | Admitting: Internal Medicine

## 2018-04-10 DIAGNOSIS — R058 Other specified cough: Secondary | ICD-10-CM

## 2018-04-10 DIAGNOSIS — R05 Cough: Secondary | ICD-10-CM

## 2018-04-10 DIAGNOSIS — R059 Cough, unspecified: Secondary | ICD-10-CM

## 2018-04-10 LAB — RESPIRATORY ALLERGY PROFILE REGION II ~~LOC~~

## 2018-04-10 LAB — ANGIOTENSIN CONVERTING ENZYME: Angiotensin-Converting Enzyme: 28 U/L (ref 9–67)

## 2018-04-10 LAB — INTERPRETATION:

## 2018-04-10 NOTE — Telephone Encounter (Signed)
DONE sep sep note under orders only 03/1918

## 2018-04-10 NOTE — Progress Notes (Signed)
Order sent to [facility] 

## 2018-04-17 ENCOUNTER — Ambulatory Visit (HOSPITAL_COMMUNITY)
Admission: RE | Admit: 2018-04-17 | Discharge: 2018-04-17 | Disposition: A | Discharge status: Home / Self Care | Payer: BLUE CROSS/BLUE SHIELD | Source: Ambulatory Visit | Attending: Internal Medicine | Admitting: Internal Medicine

## 2018-04-17 DIAGNOSIS — R059 Cough, unspecified: Secondary | ICD-10-CM

## 2018-04-17 DIAGNOSIS — R05 Cough: Secondary | ICD-10-CM | POA: Insufficient documentation

## 2018-04-17 DIAGNOSIS — R058 Other specified cough: Secondary | ICD-10-CM

## 2018-04-18 ENCOUNTER — Ambulatory Visit (INDEPENDENT_AMBULATORY_CARE_PROVIDER_SITE_OTHER): Payer: BLUE CROSS/BLUE SHIELD | Admitting: Internal Medicine

## 2018-04-18 ENCOUNTER — Encounter: Payer: Self-pay | Admitting: Internal Medicine

## 2018-04-18 ENCOUNTER — Ambulatory Visit (INDEPENDENT_AMBULATORY_CARE_PROVIDER_SITE_OTHER)
Admission: RE | Admit: 2018-04-18 | Discharge: 2018-04-18 | Disposition: A | Discharge status: Home / Self Care | Payer: BLUE CROSS/BLUE SHIELD | Source: Ambulatory Visit | Attending: Internal Medicine | Admitting: Internal Medicine

## 2018-04-18 VITALS — BP 142/100 | HR 82 | Ht 72.0 in | Wt 234.0 lb

## 2018-04-18 DIAGNOSIS — R05 Cough: Secondary | ICD-10-CM | POA: Diagnosis not present

## 2018-04-18 DIAGNOSIS — R058 Other specified cough: Secondary | ICD-10-CM

## 2018-04-18 DIAGNOSIS — R918 Other nonspecific abnormal finding of lung field: Secondary | ICD-10-CM

## 2018-04-18 DIAGNOSIS — I10 Essential (primary) hypertension: Secondary | ICD-10-CM

## 2018-04-18 NOTE — Progress Notes (Signed)
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 Pulmonary Consult    Referred by Jannifer Rodney, NP. Pt has been dxed with PNA x 3 since mid March 2019.  He c/o non prod cough, mainly at night when he lies down.   fever, aches gone, cough is better except noct    p several hours most nights wakes up coughing but only transient/ not aware of any sinus symptoms other than sense of pnds but never produces any mucus, no nasal obst symptoms and  no longer sensing need for  flonase    Takes nexium after supper daily /  no arthralgias  rec Prednisone 10 mg take  4 each am x 2 days,   2 each am x 2 days,  1 each am x 2 days and stop  Change nexium to where you take Take 30-60 min before last meal of the day  GERD diet   sinus CT nl    04/18/2018  f/u ov/Toluwani Ruder re: migratory infiltrates p influenza c/w BOOP  Chief Complaint  Patient presents with  . Follow-up    Breathing is doing well and he denies any new co's. He has not had to use his albuterol.     Dyspnea:  Not limited by breathing from desired activities   Cough: gone  SABA use:  none 02: none    HB also resolved   No obvious day to day or daytime variability or assoc excess/ purulent sputum or mucus plugs or hemoptysis or cp  or chest tightness, subjective wheeze or overt sinus or hb symptoms.   Sleeping: ok flat without nocturnal  or early am exacerbation  of respiratory  c/o's or need for noct saba. Also denies any obvious fluctuation of symptoms with weather or environmental changes or other aggravating or alleviating factors except as outlined above   No unusual exposure hx or h/o childhood pna/ asthma or knowledge of premature birth.  Current Allergies, Complete Past Medical History, Past Surgical History, Family History, and Social History were reviewed in [employer] record.  ROS  The following are not active complaints unless bolded Hoarseness, sore throat, dysphagia, dental problems, itching, sneezing,  nasal congestion or discharge of excess mucus or purulent secretions, ear ache,   fever, chills, sweats, unintended wt loss or wt gain, classically pleuritic or exertional cp,  orthopnea pnd or arm/hand swelling  or leg swelling, presyncope, palpitations, abdominal pain, anorexia, nausea, vomiting, diarrhea  or change in bowel habits or change in bladder habits, change in stools or change in urine, dysuria, hematuria,  rash, arthralgias, visual complaints, headache, numbness, weakness or ataxia or problems with walking or coordination,  change in mood or  memory.  Current Meds  Medication Sig  . albuterol (PROVENTIL HFA;VENTOLIN HFA) 108 (90 Base) MCG/ACT inhaler Inhale 2 puffs into the lungs every 6 (six) hours as needed for wheezing or shortness of breath.  . esomeprazole (NEXIUM) 40 MG capsule Take 40 mg by mouth daily at 12 noon.  . fluticasone (FLONASE) 50 MCG/ACT nasal spray Place 1 spray into both nostrils 2 (two) times daily as needed for allergies or rhinitis.  . Multiple Vitamins-Minerals (ONE-A-DAY MENS HEALTH FORMULA PO) Take 1 capsule by mouth daily.                 Objective:   Physical Exam    Amb wm nad    04/18/2018      234   04/08/18 232 lb (105.2 kg)   03/10/18 231 lb 6.4 oz (105 kg)  02/10/18 234 lb (106.1 kg)     Vital signs reviewed - Note on arrival 02 sats  100% on RA and BP 142/100     HEENT: nl dentition, turbinates bilaterally, and oropharynx. Nl external ear canals without cough reflex   NECK :  without JVD/Nodes/TM/ nl carotid upstrokes bilaterally   LUNGS: no acc muscle use,  Nl contour chest which is clear to A and P bilaterally without cough on insp or exp maneuvers   CV:  RRR  no s3 or murmur or increase in P2, and no edema   ABD:  soft and nontender with nl inspiratory excursion in the supine position. No bruits or organomegaly appreciated, bowel sounds nl  MS:  Nl gait/ ext warm without deformities, calf tenderness, cyanosis or clubbing No obvious joint restrictions   SKIN: warm and dry with subtle purplish patch just above sideburn line on R  And mild seb dermatitis around nose   NEURO:  alert, approp, nl sensorium with  no motor or cerebellar deficits apparent.       CXR PA and Lateral:   04/18/2018 :    I personally reviewed images and agree with radiology impression as follows:    Mild improvement in left upper lobe infiltrate. Nodular infiltrate right upper lobe unchanged. Findings are most likely inflammatory possibly pneumonia or TB   Assessment:

## 2018-04-18 NOTE — Patient Instructions (Signed)
Please remember to go to the  x-ray department downstairs in the basement  for your tests - we will call you with the results when they are available.     

## 2018-04-18 NOTE — Progress Notes (Signed)
Spoke with pt and notified of results per Dr. Wert. Pt verbalized understanding and denied any questions. 

## 2018-04-20 ENCOUNTER — Encounter: Payer: Self-pay | Admitting: Internal Medicine

## 2018-04-20 DIAGNOSIS — I10 Essential (primary) hypertension: Secondary | ICD-10-CM | POA: Insufficient documentation

## 2018-04-20 NOTE — Assessment & Plan Note (Signed)
Allergy profile 04/08/2018 >  Eos 0.4 /  IgE 7  RAST neg   Sinus CT 04/17/18 :  wnl   Resolved with rx of GERD > no change rx needed

## 2018-04-20 NOTE — Assessment & Plan Note (Signed)
Likely related to obesity and recent rx with prednisone > rec monitor outside office setting > Follow up per Primary Care planned

## 2018-04-20 NOTE — Assessment & Plan Note (Signed)
Onset of symptoms mid march 2019 with CT 03/11/18 c/w migrating infiltrates/ non-specific adenopathy - cxr 04/18/2018  Improved L post as dz   Improved p only short course of prednisone and clinically completely better c/w low grade boop or organizing pna so rec no more pred and f/u in 4 weeks  Discussed in detail all the  indications, usual  risks and alternatives  relative to the benefits with patient who agrees to proceed with conservative f/u as outlined    I had an extended discussion with the patient reviewing all relevant studies completed to date and  lasting 15 to 20 minutes of a 25 minute visit    Each maintenance medication was reviewed in detail including most importantly the difference between maintenance and prns and under what circumstances the prns are to be triggered using an action plan format that is not reflected in the computer generated alphabetically organized AVS.    Please see AVS for specific instructions unique to this visit that I personally wrote and verbalized to the the pt in detail and then reviewed with pt  by my nurse highlighting any  changes in therapy recommended at today's visit to their plan of care.

## 2018-05-19 ENCOUNTER — Ambulatory Visit (INDEPENDENT_AMBULATORY_CARE_PROVIDER_SITE_OTHER)
Admission: RE | Admit: 2018-05-19 | Discharge: 2018-05-19 | Disposition: A | Discharge status: Home / Self Care | Payer: BLUE CROSS/BLUE SHIELD | Source: Ambulatory Visit | Attending: Internal Medicine | Admitting: Internal Medicine

## 2018-05-19 ENCOUNTER — Other Ambulatory Visit (INDEPENDENT_AMBULATORY_CARE_PROVIDER_SITE_OTHER): Payer: BLUE CROSS/BLUE SHIELD

## 2018-05-19 ENCOUNTER — Ambulatory Visit (INDEPENDENT_AMBULATORY_CARE_PROVIDER_SITE_OTHER): Payer: BLUE CROSS/BLUE SHIELD | Admitting: Internal Medicine

## 2018-05-19 ENCOUNTER — Encounter: Payer: Self-pay | Admitting: Internal Medicine

## 2018-05-19 VITALS — BP 124/84 | HR 75 | Ht 72.0 in | Wt 238.0 lb

## 2018-05-19 DIAGNOSIS — R918 Other nonspecific abnormal finding of lung field: Secondary | ICD-10-CM

## 2018-05-19 LAB — CBC WITH DIFFERENTIAL/PLATELET
Basophils Absolute: 0.1 10*3/uL (ref 0.0–0.1)
Basophils Relative: 1.3 % (ref 0.0–3.0)
Eosinophils Absolute: 0.2 10*3/uL (ref 0.0–0.7)
Eosinophils Relative: 4.9 % (ref 0.0–5.0)
HCT: 41.2 % (ref 39.0–52.0)
Hemoglobin: 13.8 g/dL (ref 13.0–17.0)
Lymphocytes Relative: 24.1 % (ref 12.0–46.0)
Lymphs Abs: 1.2 10*3/uL (ref 0.7–4.0)
MCHC: 33.5 g/dL (ref 30.0–36.0)
MCV: 95.8 fl (ref 78.0–100.0)
Monocytes Absolute: 0.5 10*3/uL (ref 0.1–1.0)
Monocytes Relative: 9.5 % (ref 3.0–12.0)
Neutro Abs: 3 10*3/uL (ref 1.4–7.7)
Neutrophils Relative %: 60.2 % (ref 43.0–77.0)
Platelets: 234 10*3/uL (ref 150.0–400.0)
RBC: 4.3 Mil/uL (ref 4.22–5.81)
RDW: 15.2 % (ref 11.5–15.5)
WBC: 4.9 10*3/uL (ref 4.0–10.5)

## 2018-05-19 LAB — SEDIMENTATION RATE: Sed Rate: 10 mm/hr (ref 0–15)

## 2018-05-19 NOTE — Progress Notes (Signed)
Subjective:    Patient ID: Daniel Ellis, male   DOB: 1971-05-21,    MRN: [id number]    Brief patient profile:  46 yowm quit smoking 1997 works delivering propane tanks with pattern of bad sinus infections esp in fall yearly since teenager typically  better with just abx but "caught flu" from son and dx as Influenza A in mid  march 2019 with fever / aches which resolved but persistent dry cough and dx as migrating pna radiographically rx with prednisone mid late April and improved but  with last imaging = CT Chest 03/11/18 c/w pna/ non-specific adenopathy so referred to pulmonary clinic 04/08/2018 by Daniel Ellis    History of Present Illness  04/08/2018 1st [facility] office visit/ Daniel Ellis   Chief Complaint  Patient presents with  . Pulmonary Consult    Referred by Daniel Rodneyhristy Hawks, NP. Pt has been dxed with PNA x 3 since mid March 2019.  He c/o non prod cough, mainly at night when he lies down.   fever, aches gone, cough is better except noct    p several hours most nights wakes up coughing but only transient/ not aware of any sinus symptoms other than sense of pnds but never produces any mucus, no nasal obst symptoms and  no longer sensing need for  flonase    Takes nexium after supper daily /  no arthralgias  rec Prednisone 10 mg take  4 each am x 2 days,   2 each am x 2 days,  1 each am x 2 days and stop  Change nexium to where you take Take 30-60 min before last meal of the day  GERD diet   sinus CT nl    04/18/2018  f/u ov/Daniel Ellis re: migratory infiltrates p influenza c/w BOOP  Chief Complaint  Patient presents with  . Follow-up    Breathing is doing well and he denies any new co's. He has not had to use his albuterol.    Dyspnea:  Not limited by breathing from desired activities   Cough: gone SABA use:  none 02: none   HB also resolved  rec No change rx    05/19/2018  f/u ov/Daniel Ellis re: f/u pulmonary infiltrates ? boop ? Sarcoid?  Chief Complaint  Patient presents with  .  Follow-up    CXR repeated. Breathing is doing well and no new co's. He has not had to use his albuterol.    Dyspnea:  Not limited by breathing from desired activities   Cough: none Sleeping: ok SABA use: none used since last ov    No obvious day to day or daytime variability or assoc excess/ purulent sputum or mucus plugs or hemoptysis or cp or chest tightness, subjective wheeze or overt sinus or hb symptoms.   Sleeping flat  without nocturnal  or early am exacerbation  of respiratory  c/o's or need for noct saba. Also denies any obvious fluctuation of symptoms with weather or environmental changes or other aggravating or alleviating factors except as outlined above   No unusual exposure hx or h/o childhood pna/ asthma or knowledge of premature birth.  Current Allergies, Complete Past Medical History, Past Surgical History, Family History, and Social History were reviewed in Daniel Ellis electronic medical record.  ROS  The following are not active complaints unless bolded Hoarseness, sore throat, dysphagia, dental problems, itching, sneezing,  nasal congestion or discharge of excess mucus or purulent secretions, ear ache,   fever, chills, sweats, unintended wt loss or  wt gain, classically pleuritic or exertional cp,  orthopnea pnd or arm/hand swelling  or leg swelling, presyncope, palpitations, abdominal pain, anorexia, nausea, vomiting, diarrhea  or change in bowel habits or change in bladder habits, change in stools or change in urine, dysuria, hematuria,  rash, arthralgias, visual complaints, headache, numbness, weakness or ataxia or problems with walking or coordination,  change in mood or  memory.        Current Meds  Medication Sig  . albuterol (PROVENTIL HFA;VENTOLIN HFA) 108 (90 Base) MCG/ACT inhaler Inhale 2 puffs into the lungs every 6 (six) hours as needed for wheezing or shortness of breath.  . esomeprazole (NEXIUM) 40 MG capsule Take 40 mg by mouth daily at 12 noon.  .  fluticasone (FLONASE) 50 MCG/ACT nasal spray Place 1 spray into both nostrils 2 (two) times daily as needed for allergies or rhinitis.  . Multiple Vitamins-Minerals (ONE-A-DAY MENS HEALTH FORMULA PO) Take 1 capsule by mouth daily.              Objective:   Physical Exam  amb wm nad   05/19/2018      238  04/18/2018      234   04/08/18 232 lb (105.2 kg)  03/10/18 231 lb 6.4 oz (105 kg)  02/10/18 234 lb (106.1 kg)      Vital signs reviewed - Note on arrival 02 sats  99% on RA        HEENT: nl dentition, turbinates bilaterally, and oropharynx. Nl external ear canals without cough reflex   NECK :  without JVD/Nodes/TM/ nl carotid upstrokes bilaterally   LUNGS: no acc muscle use,  Nl contour chest which is clear to A and P bilaterally without cough on insp or exp maneuvers   CV:  RRR  no s3 or murmur or increase in P2, and no edema   ABD:  soft and nontender with nl inspiratory excursion in the supine position. No bruits or organomegaly appreciated, bowel sounds nl  MS:  Nl gait/ ext warm without deformities, calf tenderness, cyanosis or clubbing No obvious joint restrictions   SKIN: warm and dry with purplish plaque just ant to R sideburn   NEURO:  alert, approp, nl sensorium with  no motor or cerebellar deficits apparent.      CXR PA and Lateral:   05/19/2018 :    I personally reviewed images and agree with radiology impression as follows:   Grossly unchanged left greater than right mid and upper lung patchy areas of nodular consolidation. Findings may be infectious or inflammatory in etiology. Malignancy not excluded. Consider further evaluation with short-term follow-up chest radiograph or CT in 4 weeks.       Lab Results  Component Value Date   ESRSEDRATE 10 05/19/2018   ESRSEDRATE 29 (H) 04/08/2018     Labs ordered 05/19/2018    Quant Tb > neg   Assessment:

## 2018-05-19 NOTE — Patient Instructions (Addendum)
Please see patient coordinator before you leave today  to schedule HRCT of chest and I will call with results   Please remember to go to the lab department downstairs in the basement  for your tests - we will call you with the results when they are available.  Will arrange for pulmonary follow up once we have the above results

## 2018-05-21 LAB — QUANTIFERON-TB GOLD PLUS
Mitogen-NIL: >10 IU/mL
NIL: 0.02 IU/mL
QuantiFERON-TB Gold Plus: NEGATIVE
TB1-NIL: 0.01 IU/mL
TB2-NIL: 0.01 IU/mL

## 2018-05-23 ENCOUNTER — Encounter: Payer: Self-pay | Admitting: Internal Medicine

## 2018-05-23 NOTE — Assessment & Plan Note (Signed)
Onset of symptoms mid march 2019 with CT 03/11/18 c/w migrating infiltrates/ non-specific adenopathy - cxr 04/18/2018  Improved L post as dz - Quant Tb 05/20/18 > neg   - HRCT rec next   Miscellaneous:Alv microlithiasis, alv proteinosis, asp, bronchiectais, BOOP  / organizing pna  ARDS/ AIP Occupational dz/ HSP Neoplasm Infection Drug  Pulmonary emboli, Protein disorders Edema/Eosinophilic dz Sarcoidosis Connective tissue dz seems unlikely with esr so low  Hist X / Hemorrhage Idiopathic    HRCT best option next  Discussed in detail all the  indications, usual  risks and alternatives  relative to the benefits with patient who agrees to proceed with w/u as outlined.

## 2018-05-28 ENCOUNTER — Telehealth: Payer: Self-pay | Admitting: Family

## 2018-05-28 MED ORDER — PAROXETINE HCL 20 MG PO TABS: 20.0000 mg | ORAL_TABLET | Freq: Every day | ORAL | 2 refills | Status: DC

## 2018-05-28 NOTE — Telephone Encounter (Signed)
Pt called and aware  - med sent

## 2018-05-28 NOTE — Telephone Encounter (Signed)
This patient has premature ejaculation.  We will try an SSRI like Paxil and start with 20 mg 1/2-1 daily and have patient call us back in 3 to 4 weeks as far as progress with this.  Give him #30 with refills.

## 2018-05-28 NOTE — Telephone Encounter (Signed)
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  Dr. Christell ConstantMoore told him he would be back in the office today and for the patient to contact him and he would check on it for him.  I told the patient I would forward you a message so you could speak with Dr. Christell ConstantMoore.  This patient normally sees [place]y and is not on any meds at this time for depression.

## 2018-05-29 ENCOUNTER — Ambulatory Visit (HOSPITAL_COMMUNITY)
Admission: RE | Admit: 2018-05-29 | Discharge: 2018-05-29 | Disposition: A | Discharge status: Home / Self Care | Payer: BLUE CROSS/BLUE SHIELD | Source: Ambulatory Visit | Attending: Internal Medicine | Admitting: Internal Medicine

## 2018-05-29 DIAGNOSIS — Z9049 Acquired absence of other specified parts of digestive tract: Secondary | ICD-10-CM | POA: Diagnosis not present

## 2018-05-29 DIAGNOSIS — R918 Other nonspecific abnormal finding of lung field: Secondary | ICD-10-CM | POA: Diagnosis present

## 2018-05-29 DIAGNOSIS — R590 Localized enlarged lymph nodes: Secondary | ICD-10-CM | POA: Insufficient documentation

## 2018-05-29 DIAGNOSIS — J84116 Cryptogenic organizing pneumonia: Secondary | ICD-10-CM | POA: Insufficient documentation

## 2018-05-30 NOTE — Progress Notes (Signed)
Spoke with pt and notified of results per Dr. Wert. Pt verbalized understanding and denied any questions. 

## 2018-09-22 ENCOUNTER — Ambulatory Visit: Payer: BLUE CROSS/BLUE SHIELD | Admitting: Internal Medicine

## 2018-12-05 ENCOUNTER — Telehealth: Payer: Self-pay | Admitting: Family

## 2018-12-05 MED ORDER — PAROXETINE HCL 20 MG PO TABS: 20.0000 mg | ORAL_TABLET | Freq: Every day | ORAL | 3 refills | Status: DC

## 2018-12-05 NOTE — Telephone Encounter (Signed)
Pt aware rx sent.  

## 2018-12-22 ENCOUNTER — Encounter: Payer: Self-pay | Admitting: Nurse Practitioner

## 2018-12-22 ENCOUNTER — Ambulatory Visit (INDEPENDENT_AMBULATORY_CARE_PROVIDER_SITE_OTHER): Payer: BLUE CROSS/BLUE SHIELD | Admitting: Nurse Practitioner

## 2018-12-22 VITALS — BP 117/84 | HR 69 | Temp 97.3°F | Ht 72.0 in | Wt 245.0 lb

## 2018-12-22 DIAGNOSIS — J0100 Acute maxillary sinusitis, unspecified: Secondary | ICD-10-CM

## 2018-12-22 MED ORDER — HYDROCODONE-HOMATROPINE 5-1.5 MG/5ML PO SYRP: 5.0000 mL | ORAL_SOLUTION | Freq: Four times a day (QID) | ORAL | 0 refills | Status: DC | PRN

## 2018-12-22 MED ORDER — AMOXICILLIN 875 MG PO TABS: 875.0000 mg | ORAL_TABLET | Freq: Two times a day (BID) | ORAL | 0 refills | Status: DC

## 2018-12-22 NOTE — Progress Notes (Signed)
Subjective:    Patient ID: [name], male    DOB: 30-Dec-1970, 48 y.o.   MRN: [id number]   Chief Complaint: Cough (Doesn't want a Zpack) and Nasal Congestion   HPI Patient comes  in today c/o cough and congestion. Started this past Saturday. No fever. Cough is productive at times.  Review of Systems  Constitutional: Negative for chills and fever.  HENT: Positive for congestion and rhinorrhea. Negative for sore throat and trouble swallowing.   Respiratory: Positive for cough (productive cough). Negative for shortness of breath.   Cardiovascular: Negative.   Gastrointestinal: Negative.   Musculoskeletal: Negative for myalgias.  Neurological: Positive for headaches.  All other systems reviewed and are negative.      Objective:   Physical Exam Vitals signs and nursing note reviewed.  Constitutional:      Appearance: Normal appearance. He is obese.  HENT:     Right Ear: Hearing, tympanic membrane, ear canal and external ear normal.     Left Ear: Hearing, tympanic membrane, ear canal and external ear normal.     Nose: Mucosal edema, congestion and rhinorrhea present.     Right Sinus: Maxillary sinus tenderness present.     Left Sinus: Maxillary sinus tenderness present.     Mouth/Throat:     Lips: Pink.     Mouth: Mucous membranes are moist.     Pharynx: No posterior oropharyngeal erythema.     Tonsils: Swelling: 0 on the right. 0 on the left.  Neck:     Musculoskeletal: Normal range of motion and neck supple.  Cardiovascular:     Rate and Rhythm: Normal rate and regular rhythm.     Heart sounds: Normal heart sounds.  Pulmonary:     Effort: Pulmonary effort is normal.     Breath sounds: Normal breath sounds.     Comments: Deep wet cough  Lymphadenopathy:     Cervical: No cervical adenopathy.  Skin:    General: Skin is warm and dry.  Neurological:     General: No focal deficit present.     Mental Status: He is alert and oriented to person, place, and time.    Psychiatric:        Mood and Affect: Mood normal.        Behavior: Behavior normal.    BP 117/84   Pulse 69   Temp (!) 97.3 F (36.3 C) (Oral)   Ht 6' (1.829 m)   Wt 245 lb (111.1 kg)   BMI 33.23 kg/m      [name] in today with chief complaint of Cough (Doesn't want a Zpack) and Nasal Congestion   1. Acute non-recurrent maxillary sinusitis 1. Take meds as prescribed 2. Use a cool mist humidifier especially during the winter months and when heat has been humid. 3. Use saline nose sprays frequently 4. Saline irrigations of the nose can be very helpful if done frequently.  * 4X daily for 1 week*  * Use of a nettie pot can be helpful with this. Follow directions with this* 5. Drink plenty of fluids 6. Keep thermostat turn down low 7.For any cough or congestion  Use plain Mucinex- regular strength or max strength is fine   * Children- consult with Pharmacist for dosing 8. For fever or aces or pains- take tylenol or ibuprofen appropriate for age and weight.  * for fevers greater than 101 orally you may alternate ibuprofen and tylenol every  3 hours.   Meds ordered this  encounter  Medications  . amoxicillin (AMOXIL) 875 MG tablet    Sig: Take 1 tablet (875 mg total) by mouth 2 (two) times daily. 1 po BID    Dispense:  20 tablet    Refill:  0    Order Specific Question:   Supervising Provider    Answer:   Arville Care A [id number]  . HYDROcodone-homatropine (HYCODAN) 5-1.5 MG/5ML syrup    Sig: Take 5 mLs by mouth every 6 (six) hours as needed for cough.    Dispense:  120 mL    Refill:  0    Order Specific Question:   Supervising Provider    Answer:   Nils Pyle [[id number]]    Mary-Margaret Daphine Deutscher, FNP

## 2018-12-22 NOTE — Patient Instructions (Signed)

## 2019-02-09 IMAGING — DX DG CHEST 2V
2 series · 2 of 2 positions shown · non-contrast
Comparison: Chest radiograph 04/18/2018

CLINICAL DATA: Follow-up exam.

EXAM:
CHEST - 2 VIEW

[chest pa]
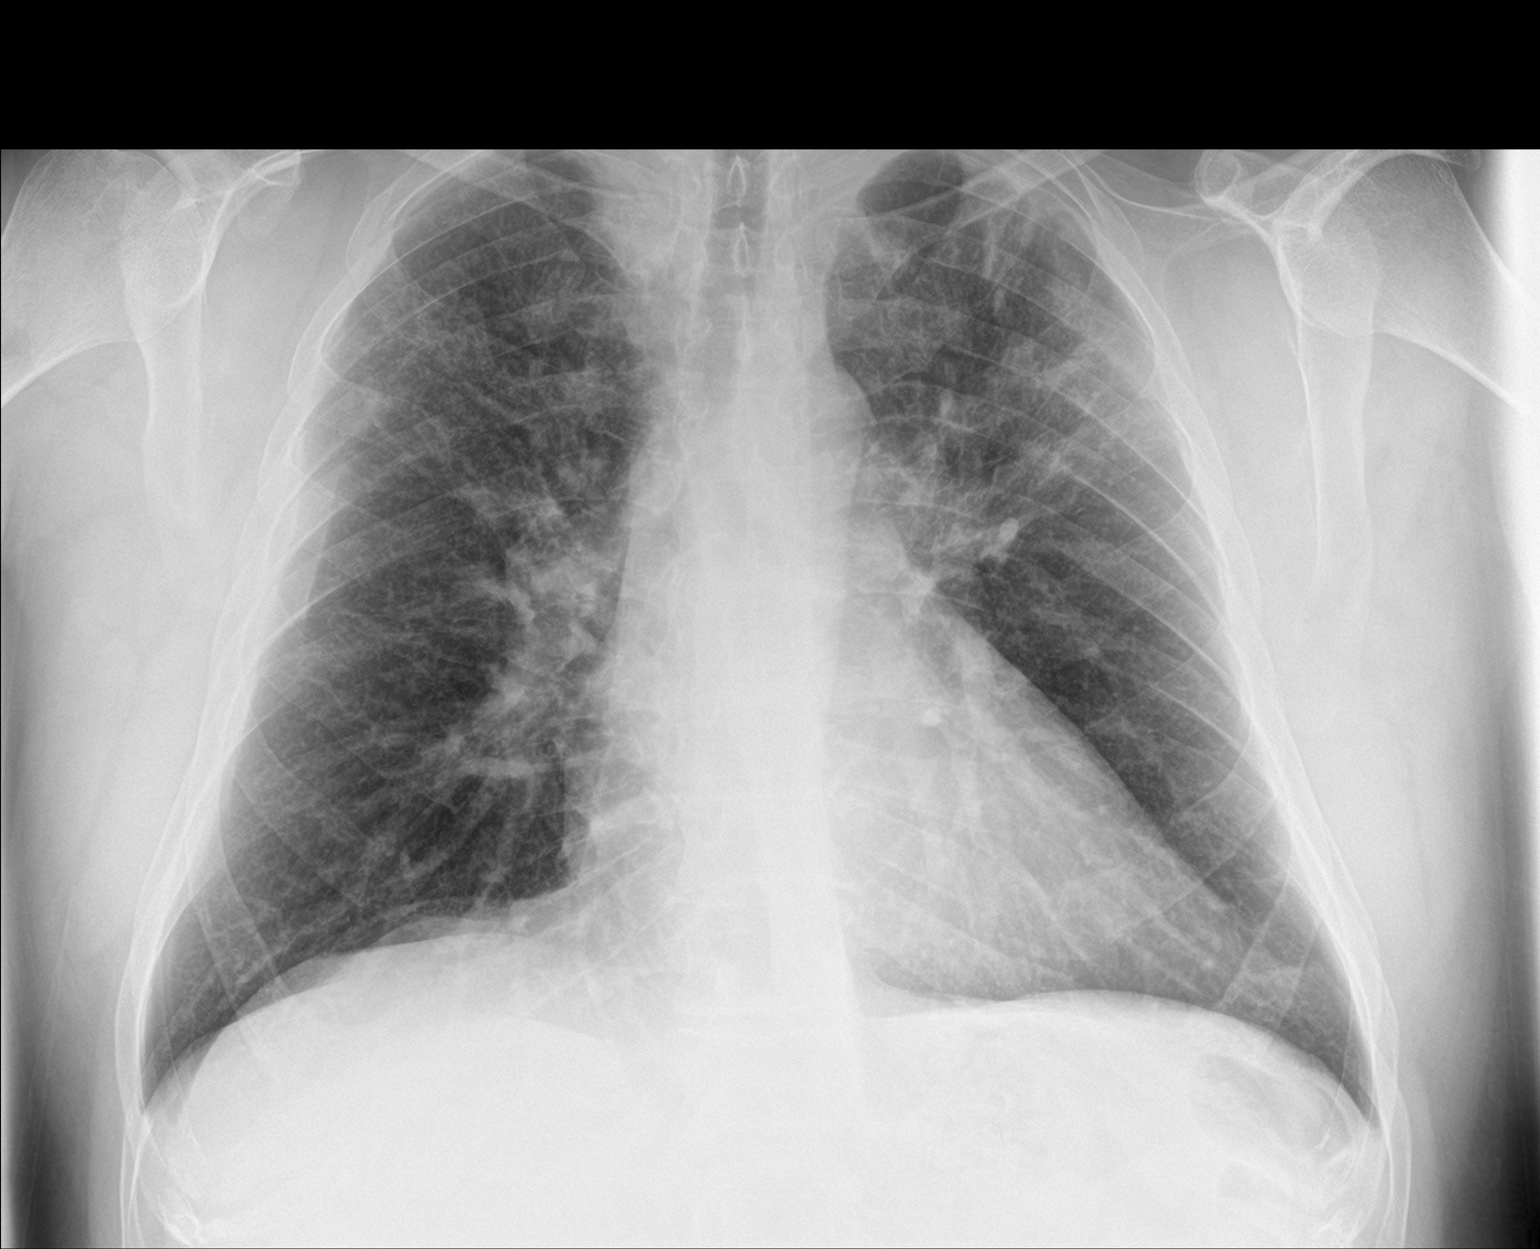

[chest lat]
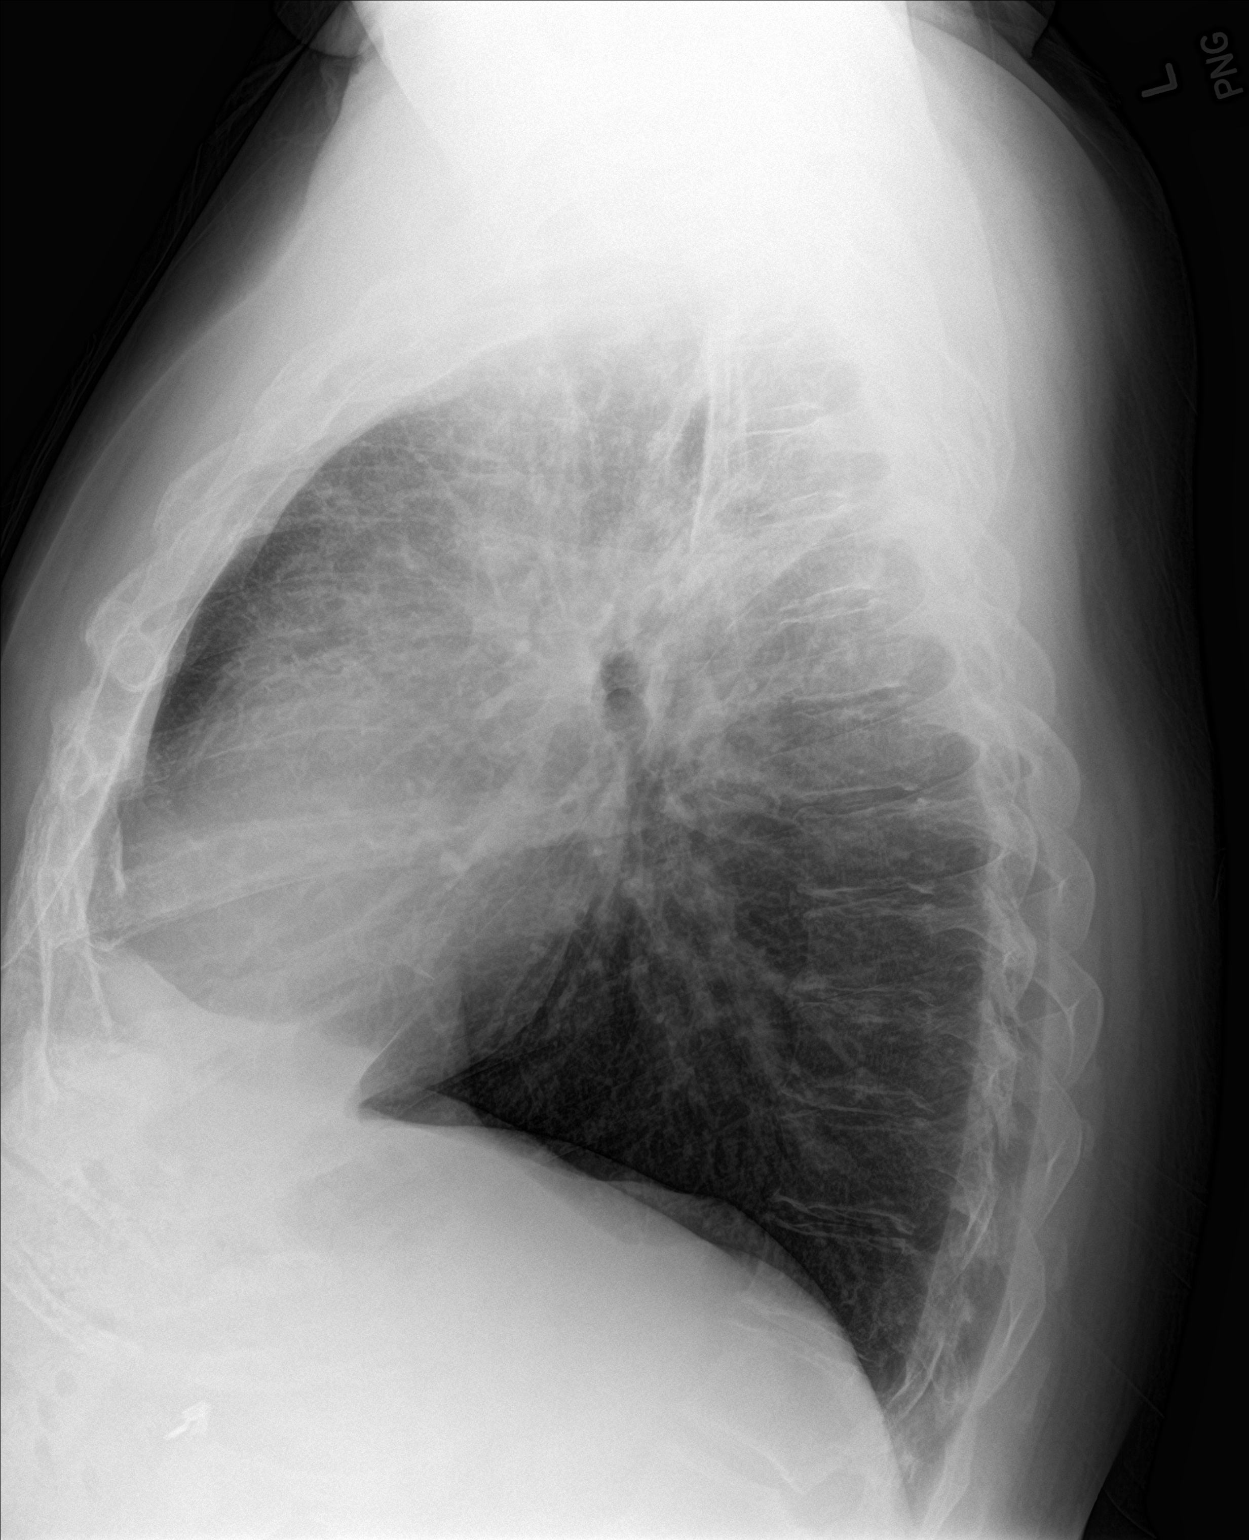

[2 of 2 positions shown; findings below may reference images not displayed]

FINDINGS: Stable cardiac and mediastinal contours. Unchanged left greater than
right mid and upper lung patchy areas of consolidation. No pleural
effusion or pneumothorax. Thoracic spine degenerative changes.
Cholecystectomy clips.
IMPRESSION: Grossly unchanged left greater than right mid and upper lung patchy
areas of nodular consolidation. Findings may be infectious or
inflammatory in etiology. Malignancy not excluded. Consider further
evaluation with short-term follow-up chest radiograph or CT in 4
weeks.

## 2019-02-23 ENCOUNTER — Ambulatory Visit (INDEPENDENT_AMBULATORY_CARE_PROVIDER_SITE_OTHER): Payer: BLUE CROSS/BLUE SHIELD | Admitting: Family Medicine

## 2019-02-23 ENCOUNTER — Other Ambulatory Visit: Payer: Self-pay

## 2019-02-23 ENCOUNTER — Encounter: Payer: Self-pay | Admitting: Family Medicine

## 2019-02-23 VITALS — BP 132/87 | HR 73 | Temp 97.0°F | Ht 72.0 in | Wt 252.0 lb

## 2019-02-23 DIAGNOSIS — R609 Edema, unspecified: Secondary | ICD-10-CM

## 2019-02-23 DIAGNOSIS — R600 Localized edema: Secondary | ICD-10-CM

## 2019-02-23 NOTE — Progress Notes (Signed)
Subjective:  Patient ID: Daniel Ellis, male    DOB: August 09, 1971  Age: 48 y.o. MRN: [id number]  CC: Lower legs and feet swelling   HPI [name] presents for 1 month of increasing swelling in the lower extremities.  Possibly lasting as long as 6 weeks.  No previous problems with swelling.  Denies any pain associated.  No redness.  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  He does not get much exercise of his legs.  He does not follow a particular diet.  His wife is a good cook.  They do try to avoid the saltshaker.  Patient denies shortness of breath.  He does not have chest pain.  No edema other than from knee down. Depression screen [facility] 2/9 02/23/2019 12/22/2018 03/10/2018  Decreased Interest 0 0 0  Down, Depressed, Hopeless 0 0 0  PHQ - 2 Score 0 0 0    History Donte has a past medical history of GERD (gastroesophageal reflux disease).   He has a past surgical history that includes Hernia repair and Cholecystectomy.   His family history includes Diabetes in his father.He reports that he quit smoking about 22 years ago. His smoking use included cigarettes. He has a 7.50 pack-year smoking history. His smokeless tobacco use includes snuff. He reports current alcohol use. He reports that he does not use drugs.    ROS Review of Systems  Constitutional: Negative for fever.  Respiratory: Negative for shortness of breath.   Cardiovascular: Positive for leg swelling. Negative for chest pain.  Musculoskeletal: Negative for arthralgias.  Skin: Negative for rash.    Objective:  BP 132/87   Pulse 73   Temp (!) 97 F (36.1 C) (Oral)   Ht 6' (1.829 m)   Wt 252 lb (114.3 kg)   BMI 34.18 kg/m   BP Readings from Last 3 Encounters:  02/23/19 132/87  12/22/18 117/84  05/19/18 124/84    Wt Readings from Last 3 Encounters:  02/23/19 252 lb (114.3 kg)  12/22/18 245 lb (111.1 kg)  05/19/18 238 lb (108 kg)     Physical  Exam Vitals signs reviewed.  Constitutional:      Appearance: He is well-developed.  HENT:     Head: Normocephalic and atraumatic.     Right Ear: Tympanic membrane and external ear normal. No decreased hearing noted.     Left Ear: Tympanic membrane and external ear normal. No decreased hearing noted.     Mouth/Throat:     Pharynx: No oropharyngeal exudate or posterior oropharyngeal erythema.  Eyes:     Pupils: Pupils are equal, round, and reactive to light.  Neck:     Musculoskeletal: Normal range of motion and neck supple.  Cardiovascular:     Rate and Rhythm: Normal rate and regular rhythm.     Heart sounds: No murmur.  Pulmonary:     Effort: No respiratory distress.     Breath sounds: Normal breath sounds.  Abdominal:     General: Bowel sounds are normal.     Palpations: Abdomen is soft. There is no mass.     Tenderness: There is no abdominal tenderness.  Musculoskeletal: Normal range of motion.        General: Swelling (1Plus to 2+ bilaterally.  Pitting noted at mid shin bilaterally amities are neurovascularly intact grossly) present. No tenderness, deformity or signs of injury.     Right lower leg: Edema present.  Left lower leg: Edema present.  Skin:    General: Skin is warm and dry.     Findings: No erythema.  Neurological:     General: No focal deficit present.     Mental Status: He is oriented to person, place, and time.       Assessment & Plan:   Roczen was seen today for lower legs and feet swelling.  Diagnoses and all orders for this visit:  Swelling -     CBC with Differential/Platelet -     CMP14+EGFR -     TSH -     D-dimer, quantitative (not at [facility]) -     Brain natriuretic peptide  Localized edema       I have discontinued Susumu A. Steuber's amoxicillin and HYDROcodone-homatropine. I am also having him maintain his esomeprazole, Multiple Vitamins-Minerals (ONE-A-DAY MENS HEALTH FORMULA PO), fluticasone, albuterol, and PARoxetine.  Allergies  as of 02/23/2019   No Known Allergies     Medication List       Accurate as of Feb 23, 2019 10:59 AM. Always use your most recent med list.        albuterol 108 (90 Base) MCG/ACT inhaler Commonly known as:  VENTOLIN HFA Inhale 2 puffs into the lungs every 6 (six) hours as needed for wheezing or shortness of breath.   esomeprazole 40 MG capsule Commonly known as:  NEXIUM Take 40 mg by mouth daily at 12 noon.   fluticasone 50 MCG/ACT nasal spray Commonly known as:  FLONASE Place 1 spray into both nostrils 2 (two) times daily as needed for allergies or rhinitis.   ONE-A-DAY MENS HEALTH FORMULA PO Take 1 capsule by mouth daily.   PARoxetine 20 MG tablet Commonly known as:  Paxil Take 1 tablet (20 mg total) by mouth daily.      Discussed lifestyle occluding low-salt diet..  Elevation of the lower extremities.  Weight loss.  Keep the legs moving as much as possible while driving. DASH handout given  Follow-up: Return in about 6 weeks (around 04/06/2019).  Claretta Fraise, M.D.

## 2019-02-23 NOTE — Patient Instructions (Signed)
DASH Eating Plan  DASH stands for "Dietary Approaches to Stop Hypertension." The DASH eating plan is a healthy eating plan that has been shown to reduce high blood pressure (hypertension). It may also reduce your risk for type 2 diabetes, heart disease, and stroke. The DASH eating plan may also help with weight loss.  What are tips for following this plan?    General guidelines   Avoid eating more than 2,300 mg (milligrams) of salt (sodium) a day. If you have hypertension, you may need to reduce your sodium intake to 1,500 mg a day.   Limit alcohol intake to no more than 1 drink a day for nonpregnant women and 2 drinks a day for men. One drink equals 12 oz of beer, 5 oz of wine, or 1 oz of hard liquor.   Work with your health care provider to maintain a healthy body weight or to lose weight. Ask what an ideal weight is for you.   Get at least 30 minutes of exercise that causes your heart to beat faster (aerobic exercise) most days of the week. Activities may include walking, swimming, or biking.   Work with your health care provider or diet and nutrition specialist (dietitian) to adjust your eating plan to your individual calorie needs.  Reading food labels     Check food labels for the amount of sodium per serving. Choose foods with less than 5 percent of the Daily Value of sodium. Generally, foods with less than 300 mg of sodium per serving fit into this eating plan.   To find whole grains, look for the word "whole" as the first word in the ingredient list.  Shopping   Buy products labeled as "low-sodium" or "no salt added."   Buy fresh foods. Avoid canned foods and premade or frozen meals.  Cooking   Avoid adding salt when cooking. Use salt-free seasonings or herbs instead of table salt or sea salt. Check with your health care provider or pharmacist before using salt substitutes.   Do not fry foods. Cook foods using healthy methods such as baking, boiling, grilling, and broiling instead.   Cook with  heart-healthy oils, such as olive, canola, soybean, or sunflower oil.  Meal planning   Eat a balanced diet that includes:  ? 5 or more servings of fruits and vegetables each day. At each meal, try to fill half of your plate with fruits and vegetables.  ? Up to 6-8 servings of whole grains each day.  ? Less than 6 oz of lean meat, poultry, or fish each day. A 3-oz serving of meat is about the same size as a deck of cards. One egg equals 1 oz.  ? 2 servings of low-fat dairy each day.  ? A serving of nuts, seeds, or beans 5 times each week.  ? Heart-healthy fats. Healthy fats called Omega-3 fatty acids are found in foods such as flaxseeds and coldwater fish, like sardines, salmon, and mackerel.   Limit how much you eat of the following:  ? Canned or prepackaged foods.  ? Food that is high in trans fat, such as fried foods.  ? Food that is high in saturated fat, such as fatty meat.  ? Sweets, desserts, sugary drinks, and other foods with added sugar.  ? Full-fat dairy products.   Do not salt foods before eating.   Try to eat at least 2 vegetarian meals each week.   Eat more home-cooked food and less restaurant, buffet, and fast food.     When eating at a restaurant, ask that your food be prepared with less salt or no salt, if possible.  What foods are recommended?  The items listed may not be a complete list. Talk with your dietitian about what dietary choices are best for you.  Grains  Whole-grain or whole-wheat bread. Whole-grain or whole-wheat pasta. Brown rice. Oatmeal. Quinoa. Bulgur. Whole-grain and low-sodium cereals. Pita bread. Low-fat, low-sodium crackers. Whole-wheat flour tortillas.  Vegetables  Fresh or frozen vegetables (raw, steamed, roasted, or grilled). Low-sodium or reduced-sodium tomato and vegetable juice. Low-sodium or reduced-sodium tomato sauce and tomato paste. Low-sodium or reduced-sodium canned vegetables.  Fruits  All fresh, dried, or frozen fruit. Canned fruit in natural juice (without  added sugar).  Meat and other protein foods  Skinless chicken or turkey. Ground chicken or turkey. Pork with fat trimmed off. Fish and seafood. Egg whites. Dried beans, peas, or lentils. Unsalted nuts, nut butters, and seeds. Unsalted canned beans. Lean cuts of beef with fat trimmed off. Low-sodium, lean deli meat.  Dairy  Low-fat (1%) or fat-free (skim) milk. Fat-free, low-fat, or reduced-fat cheeses. Nonfat, low-sodium ricotta or cottage cheese. Low-fat or nonfat yogurt. Low-fat, low-sodium cheese.  Fats and oils  Soft margarine without trans fats. Vegetable oil. Low-fat, reduced-fat, or light mayonnaise and salad dressings (reduced-sodium). Canola, safflower, olive, soybean, and sunflower oils. Avocado.  Seasoning and other foods  Herbs. Spices. Seasoning mixes without salt. Unsalted popcorn and pretzels. Fat-free sweets.  What foods are not recommended?  The items listed may not be a complete list. Talk with your dietitian about what dietary choices are best for you.  Grains  Baked goods made with fat, such as croissants, muffins, or some breads. Dry pasta or rice meal packs.  Vegetables  Creamed or fried vegetables. Vegetables in a cheese sauce. Regular canned vegetables (not low-sodium or reduced-sodium). Regular canned tomato sauce and paste (not low-sodium or reduced-sodium). Regular tomato and vegetable juice (not low-sodium or reduced-sodium). Pickles. Olives.  Fruits  Canned fruit in a light or heavy syrup. Fried fruit. Fruit in cream or butter sauce.  Meat and other protein foods  Fatty cuts of meat. Ribs. Fried meat. Bacon. Sausage. Bologna and other processed lunch meats. Salami. Fatback. Hotdogs. Bratwurst. Salted nuts and seeds. Canned beans with added salt. Canned or smoked fish. Whole eggs or egg yolks. Chicken or turkey with skin.  Dairy  Whole or 2% milk, cream, and half-and-half. Whole or full-fat cream cheese. Whole-fat or sweetened yogurt. Full-fat cheese. Nondairy creamers. Whipped toppings.  Processed cheese and cheese spreads.  Fats and oils  Butter. Stick margarine. Lard. Shortening. Ghee. Bacon fat. Tropical oils, such as coconut, palm kernel, or palm oil.  Seasoning and other foods  Salted popcorn and pretzels. Onion salt, garlic salt, seasoned salt, table salt, and sea salt. Worcestershire sauce. Tartar sauce. Barbecue sauce. Teriyaki sauce. Soy sauce, including reduced-sodium. Steak sauce. Canned and packaged gravies. Fish sauce. Oyster sauce. Cocktail sauce. Horseradish that you find on the shelf. Ketchup. Mustard. Meat flavorings and tenderizers. Bouillon cubes. Hot sauce and Tabasco sauce. Premade or packaged marinades. Premade or packaged taco seasonings. Relishes. Regular salad dressings.  Where to find more information:   National Heart, Lung, and Blood Institute: www.nhlbi.nih.gov   American Heart Association: www.heart.org  Summary   The DASH eating plan is a healthy eating plan that has been shown to reduce high blood pressure (hypertension). It may also reduce your risk for type 2 diabetes, heart disease, and stroke.   With the   DASH eating plan, you should limit salt (sodium) intake to 2,300 mg a day. If you have hypertension, you may need to reduce your sodium intake to 1,500 mg a day.   When on the DASH eating plan, aim to eat more fresh fruits and vegetables, whole grains, lean proteins, low-fat dairy, and heart-healthy fats.   Work with your health care provider or diet and nutrition specialist (dietitian) to adjust your eating plan to your individual calorie needs.  This information is not intended to replace advice given to you by your health care provider. Make sure you discuss any questions you have with your health care provider.  Document Released: 09/27/2011 Document Revised: 10/01/2016 Document Reviewed: 10/01/2016  Elsevier Interactive Patient Education  2019 [employer] Inc[employer]

## 2019-02-25 ENCOUNTER — Other Ambulatory Visit: Payer: Self-pay | Admitting: Family Medicine

## 2019-02-25 DIAGNOSIS — E039 Hypothyroidism, unspecified: Secondary | ICD-10-CM

## 2019-02-25 LAB — CMP14+EGFR
ALT: 18 IU/L (ref 0–44)
AST: 48 IU/L — ABNORMAL HIGH (ref 0–40)
Albumin/Globulin Ratio: 2.1 (ref 1.2–2.2)
Albumin: 5.3 g/dL — ABNORMAL HIGH (ref 4.0–5.0)
Alkaline Phosphatase: 57 IU/L (ref 39–117)
BUN/Creatinine Ratio: 9 (ref 9–20)
BUN: 14 mg/dL (ref 6–24)
Bilirubin Total: 0.4 mg/dL (ref 0.0–1.2)
CO2: 26 mmol/L (ref 20–29)
Calcium: 9.7 mg/dL (ref 8.7–10.2)
Chloride: 99 mmol/L (ref 96–106)
Creatinine, Ser: 1.61 mg/dL — ABNORMAL HIGH (ref 0.76–1.27)
GFR calc Af Amer: 57 mL/min/{1.73_m2} — ABNORMAL LOW (ref >59)
GFR calc non Af Amer: 49 mL/min/{1.73_m2} — ABNORMAL LOW (ref >59)
Globulin, Total: 2.5 g/dL (ref 1.5–4.5)
Glucose: 77 mg/dL (ref 65–99)
Potassium: 4.8 mmol/L (ref 3.5–5.2)
Sodium: 141 mmol/L (ref 134–144)
Total Protein: 7.8 g/dL (ref 6.0–8.5)

## 2019-02-25 LAB — D-DIMER, QUANTITATIVE (NOT AT ARMC): D-DIMER: 0.38 mg/L FEU (ref 0.00–0.49)

## 2019-02-25 LAB — CBC WITH DIFFERENTIAL/PLATELET
Basophils Absolute: 0 10*3/uL (ref 0.0–0.2)
Basos: 1 %
EOS (ABSOLUTE): 0.3 10*3/uL (ref 0.0–0.4)
Eos: 6 %
Hematocrit: 39.2 % (ref 37.5–51.0)
Hemoglobin: 13.3 g/dL (ref 13.0–17.7)
Immature Grans (Abs): 0 10*3/uL (ref 0.0–0.1)
Immature Granulocytes: 0 %
Lymphocytes Absolute: 0.9 10*3/uL (ref 0.7–3.1)
Lymphs: 19 %
MCH: 31.9 pg (ref 26.6–33.0)
MCHC: 33.9 g/dL (ref 31.5–35.7)
MCV: 94 fL (ref 79–97)
Monocytes Absolute: 0.5 10*3/uL (ref 0.1–0.9)
Monocytes: 10 %
Neutrophils Absolute: 3 10*3/uL (ref 1.4–7.0)
Neutrophils: 64 %
Platelets: 224 10*3/uL (ref 150–450)
RBC: 4.17 x10E6/uL (ref 4.14–5.80)
RDW: 13.8 % (ref 11.6–15.4)
WBC: 4.6 10*3/uL (ref 3.4–10.8)

## 2019-02-25 LAB — BRAIN NATRIURETIC PEPTIDE: BNP: 10 pg/mL (ref 0.0–100.0)

## 2019-02-25 LAB — TSH: TSH: 69.84 u[IU]/mL — ABNORMAL HIGH (ref 0.450–4.500)

## 2019-02-25 MED ORDER — LEVOTHYROXINE SODIUM 50 MCG PO TABS: 50.0000 ug | ORAL_TABLET | Freq: Every day | ORAL | 0 refills | Status: DC

## 2019-02-28 LAB — SPECIMEN STATUS REPORT

## 2019-02-28 LAB — T4, FREE: Free T4: <0.11 ng/dL — ABNORMAL LOW (ref 0.82–1.77)

## 2019-03-03 ENCOUNTER — Ambulatory Visit
Admission: RE | Admit: 2019-03-03 | Discharge: 2019-03-03 | Disposition: A | Discharge status: Home / Self Care | Payer: BLUE CROSS/BLUE SHIELD | Source: Ambulatory Visit | Attending: Family Medicine | Admitting: Family Medicine

## 2019-03-03 DIAGNOSIS — E039 Hypothyroidism, unspecified: Secondary | ICD-10-CM

## 2019-03-05 ENCOUNTER — Telehealth: Payer: Self-pay | Admitting: Family

## 2019-05-27 ENCOUNTER — Telehealth: Payer: Self-pay | Admitting: Family Medicine

## 2019-05-27 ENCOUNTER — Other Ambulatory Visit: Payer: Self-pay | Admitting: Family Medicine

## 2019-05-27 MED ORDER — LEVOTHYROXINE SODIUM 50 MCG PO TABS: 50.0000 ug | ORAL_TABLET | Freq: Every day | ORAL | 0 refills | Status: DC

## 2019-05-27 NOTE — Telephone Encounter (Signed)
Refilled, called and left message for pt to schedule appt

## 2019-05-27 NOTE — Telephone Encounter (Signed)
Is he still on 40mcg, and when should he get labs again

## 2019-05-27 NOTE — Telephone Encounter (Signed)
Pt called stated [facility] sent in refill request for thyroid rx - his account is no longer on hold for billing so he would like refill called in.

## 2019-05-27 NOTE — Telephone Encounter (Signed)
He needs to be seen fairly soon. Will do blood work then. Refil the 50 mcg until then. Thanks, WS

## 2019-08-27 ENCOUNTER — Other Ambulatory Visit: Payer: Self-pay | Admitting: Family Medicine

## 2019-11-25 ENCOUNTER — Other Ambulatory Visit: Payer: Self-pay

## 2019-11-26 ENCOUNTER — Encounter: Payer: Self-pay | Admitting: Family Medicine

## 2019-11-26 ENCOUNTER — Ambulatory Visit (INDEPENDENT_AMBULATORY_CARE_PROVIDER_SITE_OTHER): Payer: BLUE CROSS/BLUE SHIELD | Admitting: Family Medicine

## 2019-11-26 ENCOUNTER — Other Ambulatory Visit: Payer: Self-pay | Admitting: *Deleted

## 2019-11-26 VITALS — BP 131/88 | HR 68 | Temp 98.0°F | Ht 72.0 in | Wt 235.0 lb

## 2019-11-26 DIAGNOSIS — Z0289 Encounter for other administrative examinations: Secondary | ICD-10-CM

## 2019-11-26 DIAGNOSIS — Z024 Encounter for examination for driving license: Secondary | ICD-10-CM

## 2019-11-26 LAB — URINALYSIS
Bilirubin, UA: NEGATIVE
Glucose, UA: NEGATIVE
Ketones, UA: NEGATIVE
Leukocytes,UA: NEGATIVE
Nitrite, UA: NEGATIVE
Protein,UA: NEGATIVE
RBC, UA: NEGATIVE
Specific Gravity, UA: 1.025 (ref 1.005–1.030)
Urobilinogen, Ur: 1 mg/dL (ref 0.2–1.0)
pH, UA: 7 (ref 5.0–7.5)

## 2019-11-26 NOTE — Progress Notes (Signed)
Subjective:  Patient ID: Daniel Ellis, male    DOB: Nov 03, 1970  Age: 49 y.o. MRN: [id number]  CC: DOT   HPI Daniel Ellis presents for DOT medical clearance physical.  Mr. Tompson currently takes medication for thyroid reflux and allergic rhinitis.  He does not have a history of heart disease or hypertension.  He is not a diabetic.  Depression screen [facility] 2/9 11/26/2019 02/23/2019 12/22/2018  Decreased Interest 0 0 0  Down, Depressed, Hopeless 0 0 0  PHQ - 2 Score 0 0 0    History Daniel Ellis has a past medical history of GERD (gastroesophageal reflux disease).   He has a past surgical history that includes Hernia repair and Cholecystectomy.   His family history includes Diabetes in his father.He reports that he quit smoking about 23 years ago. His smoking use included cigarettes. He has a 7.50 pack-year smoking history. His smokeless tobacco use includes snuff. He reports current alcohol use. He reports that he does not use drugs.    ROS Review of Systems  Constitutional: Negative.   HENT: Negative.   Eyes: Negative for visual disturbance.  Respiratory: Negative for cough and shortness of breath.   Cardiovascular: Negative for chest pain and leg swelling.  Gastrointestinal: Negative for abdominal pain, diarrhea, nausea and vomiting.  Genitourinary: Negative for difficulty urinating.  Musculoskeletal: Negative for arthralgias and myalgias.  Skin: Negative for rash.  Neurological: Negative for headaches.  Psychiatric/Behavioral: Negative for sleep disturbance.    Objective:  BP 131/88   Pulse 68   Temp 98 F (36.7 C) (Temporal)   Ht 6' (1.829 m)   Wt 235 lb (106.6 kg)   BMI 31.87 kg/m   BP Readings from Last 3 Encounters:  11/26/19 131/88  02/23/19 132/87  12/22/18 117/84    Wt Readings from Last 3 Encounters:  11/26/19 235 lb (106.6 kg)  02/23/19 252 lb (114.3 kg)  12/22/18 245 lb (111.1 kg)     Physical Exam Constitutional:      Appearance: He is well-developed.    HENT:     Head: Normocephalic and atraumatic.  Eyes:     Pupils: Pupils are equal, round, and reactive to light.  Neck:     Thyroid: No thyromegaly.     Trachea: No tracheal deviation.  Cardiovascular:     Rate and Rhythm: Normal rate and regular rhythm.     Heart sounds: Normal heart sounds. No murmur. No friction rub. No gallop.   Pulmonary:     Breath sounds: Normal breath sounds. No wheezing or rales.  Abdominal:     General: Bowel sounds are normal. There is no distension.     Palpations: Abdomen is soft. There is no mass.     Tenderness: There is no abdominal tenderness.     Hernia: There is no hernia in the left inguinal area.  Genitourinary:    Penis: Normal.      Testes: Normal.  Musculoskeletal:        General: Normal range of motion.     Cervical back: Normal range of motion.  Lymphadenopathy:     Cervical: No cervical adenopathy.  Skin:    General: Skin is warm and dry.  Neurological:     Mental Status: He is alert and oriented to person, place, and time.       Assessment & Plan:   Daniel Ellis was seen today for dot.  Diagnoses and all orders for this visit:  Encounter for examination required by Department of  Transportation (DOT) -     Urinalysis       I am having [name] maintain his esomeprazole, Multiple Vitamins-Minerals (ONE-A-DAY MENS HEALTH FORMULA PO), fluticasone, albuterol, PARoxetine, and levothyroxine.  Allergies as of 11/26/2019   No Known Allergies     Medication List       Accurate as of November 26, 2019  2:39 PM. If you have any questions, ask your nurse or doctor.        albuterol 108 (90 Base) MCG/ACT inhaler Commonly known as: VENTOLIN HFA Inhale 2 puffs into the lungs every 6 (six) hours as needed for wheezing or shortness of breath.   esomeprazole 40 MG capsule Commonly known as: NEXIUM Take 40 mg by mouth daily at 12 noon.   fluticasone 50 MCG/ACT nasal spray Commonly known as: FLONASE Place 1 spray into both  nostrils 2 (two) times daily as needed for allergies or rhinitis.   levothyroxine 50 MCG tablet Commonly known as: Euthyrox Take 1 tablet (50 mcg total) by mouth daily. (Needs labwork)   ONE-A-DAY MENS HEALTH FORMULA PO Take 1 capsule by mouth daily.   PARoxetine 20 MG tablet Commonly known as: Paxil Take 1 tablet (20 mg total) by mouth daily.        Follow-up: Return in about 2 years (around 11/25/2021) for DOT exam.  Mechele Claude, M.D.

## 2019-12-04 ENCOUNTER — Ambulatory Visit (INDEPENDENT_AMBULATORY_CARE_PROVIDER_SITE_OTHER): Payer: Managed Care, Other (non HMO) | Admitting: Family

## 2019-12-04 ENCOUNTER — Other Ambulatory Visit: Payer: Self-pay

## 2019-12-04 ENCOUNTER — Encounter: Payer: Self-pay | Admitting: Family

## 2019-12-04 VITALS — BP 127/89 | HR 85 | Temp 96.8°F | Ht 72.0 in | Wt 235.8 lb

## 2019-12-04 DIAGNOSIS — K219 Gastro-esophageal reflux disease without esophagitis: Secondary | ICD-10-CM | POA: Diagnosis not present

## 2019-12-04 DIAGNOSIS — R7989 Other specified abnormal findings of blood chemistry: Secondary | ICD-10-CM

## 2019-12-04 DIAGNOSIS — Z0000 Encounter for general adult medical examination without abnormal findings: Secondary | ICD-10-CM

## 2019-12-04 DIAGNOSIS — Z1211 Encounter for screening for malignant neoplasm of colon: Secondary | ICD-10-CM

## 2019-12-04 DIAGNOSIS — Z1212 Encounter for screening for malignant neoplasm of rectum: Secondary | ICD-10-CM

## 2019-12-04 DIAGNOSIS — F411 Generalized anxiety disorder: Secondary | ICD-10-CM | POA: Insufficient documentation

## 2019-12-04 DIAGNOSIS — E039 Hypothyroidism, unspecified: Secondary | ICD-10-CM | POA: Insufficient documentation

## 2019-12-04 DIAGNOSIS — I10 Essential (primary) hypertension: Secondary | ICD-10-CM

## 2019-12-04 MED ORDER — LEVOTHYROXINE SODIUM 50 MCG PO TABS: 50.0000 ug | ORAL_TABLET | Freq: Every day | ORAL | 0 refills | Status: DC

## 2019-12-04 MED ORDER — PAROXETINE HCL 20 MG PO TABS: 20.0000 mg | ORAL_TABLET | Freq: Every day | ORAL | 3 refills | Status: DC

## 2019-12-04 NOTE — Progress Notes (Signed)
Subjective:    Patient ID: Daniel Ellis, male    DOB: 1971-03-25, 49 y.o.   MRN: [id number]  Chief Complaint  Patient presents with  . Medical Management of Chronic Issues   PT presents to the office today for CPE. He had a several CT scan that found pulmonary infiltrates that could not rule out nodules. Recommend follow up on CT scan in 6- 12 months. He saw a pulmonologist's  and was told he did not need to follow up with them unless he has any problems. Denies any SOB, chest pain, or cough.  He reports he has been out of his thyroid medication for a month.  Hypertension This is a chronic problem. The current episode started more than 1 year ago. The problem has been resolved since onset. The problem is controlled. Associated symptoms include anxiety and malaise/fatigue. Pertinent negatives include no blurred vision, peripheral edema or shortness of breath. Risk factors for coronary artery disease include obesity, male gender and sedentary lifestyle. The current treatment provides moderate improvement. There is no history of kidney disease, CAD/MI, CVA or heart failure. Identifiable causes of hypertension include a thyroid problem.  Anxiety Presents for follow-up visit. Symptoms include decreased concentration, excessive worry, nervous/anxious behavior and restlessness. Patient reports no depressed mood or shortness of breath. Symptoms occur most days. The severity of symptoms is mild. The quality of sleep is good.    Gastroesophageal Reflux He complains of belching and heartburn. He reports no coughing or no dysphagia. This is a chronic problem. The current episode started more than 1 year ago. The problem occurs occasionally. The problem has been waxing and waning. The symptoms are aggravated by certain foods. Pertinent negatives include no fatigue. He has tried a PPI for the symptoms. The treatment provided moderate relief.  Thyroid Problem Presents for follow-up visit. Symptoms include  anxiety. Patient reports no depressed mood, diaphoresis, diarrhea or fatigue. The symptoms have been stable. There is no history of heart failure.      Review of Systems  Constitutional: Positive for malaise/fatigue. Negative for diaphoresis and fatigue.  Eyes: Negative for blurred vision.  Respiratory: Negative for cough and shortness of breath.   Gastrointestinal: Positive for heartburn. Negative for diarrhea and dysphagia.  Psychiatric/Behavioral: Positive for decreased concentration. The patient is nervous/anxious.   All other systems reviewed and are negative.   Family History  Problem Relation Age of Onset  . Diabetes Father    Social History   Socioeconomic History  . Marital status: Married    Spouse name: Not on file  . Number of children: Not on file  . Years of education: Not on file  . Highest education level: Not on file  Occupational History  . Not on file  Tobacco Use  . Smoking status: Former Smoker    Packs/day: 0.75    Years: 10.00    Pack years: 7.50    Types: Cigarettes    Quit date: 10/19/1996    Years since quitting: 23.1  . Smokeless tobacco: Current User    Types: Snuff  Substance and Sexual Activity  . Alcohol use: Yes    Comment: rarely  . Drug use: No  . Sexual activity: Not on file  Other Topics Concern  . Not on file  Social History Narrative  . Not on file   Social Determinants of Health   Financial Resource Strain:   . Difficulty of Paying Living Expenses: Not on file  Food Insecurity:   . Worried About  Running Out of Food in the Last Year: Not on file  . Ran Out of Food in the Last Year: Not on file  Transportation Needs:   . Lack of Transportation (Medical): Not on file  . Lack of Transportation (Non-Medical): Not on file  Physical Activity:   . Days of Exercise per Week: Not on file  . Minutes of Exercise per Session: Not on file  Stress:   . Feeling of Stress : Not on file  Social Connections:   . Frequency of  Communication with Friends and Family: Not on file  . Frequency of Social Gatherings with Friends and Family: Not on file  . Attends Religious Services: Not on file  . Active Member of Clubs or Organizations: Not on file  . Attends Archivist Meetings: Not on file  . Marital Status: Not on file       Objective:   Physical Exam Vitals reviewed.  Constitutional:      General: He is not in acute distress.    Appearance: He is well-developed.  HENT:     Head: Normocephalic.     Right Ear: Tympanic membrane normal.     Left Ear: Tympanic membrane normal.  Eyes:     General:        Right eye: No discharge.        Left eye: No discharge.     Pupils: Pupils are equal, round, and reactive to light.  Neck:     Thyroid: No thyromegaly.  Cardiovascular:     Rate and Rhythm: Normal rate and regular rhythm.     Heart sounds: Normal heart sounds. No murmur.  Pulmonary:     Effort: Pulmonary effort is normal. No respiratory distress.     Breath sounds: Normal breath sounds. No wheezing.  Abdominal:     General: Bowel sounds are normal. There is no distension.     Palpations: Abdomen is soft.     Tenderness: There is no abdominal tenderness.  Musculoskeletal:        General: No tenderness. Normal range of motion.     Cervical back: Normal range of motion and neck supple.  Skin:    General: Skin is warm and dry.     Findings: No erythema or rash.  Neurological:     Mental Status: He is alert and oriented to person, place, and time.     Cranial Nerves: No cranial nerve deficit.     Deep Tendon Reflexes: Reflexes are normal and symmetric.  Psychiatric:        Behavior: Behavior normal.        Thought Content: Thought content normal.        Judgment: Judgment normal.       BP 127/89   Pulse 85   Temp (!) 96.8 F (36 C) (Temporal)   Ht 6' (1.829 m)   Wt 235 lb 12.8 oz (107 kg)   SpO2 100%   BMI 31.98 kg/m      Assessment & Plan:  Daniel Ellis comes in today  with chief complaint of Medical Management of Chronic Issues   Diagnosis and orders addressed:  1. Essential hypertension - CMP14+EGFR - CBC with Differential/Platelet  2. Annual physical exam - CMP14+EGFR - CBC with Differential/Platelet - Lipid panel - TSH - PSA, total and free  3. Gastroesophageal reflux disease, unspecified whether esophagitis present - CMP14+EGFR - CBC with Differential/Platelet  4. GAD (generalized anxiety disorder) - CMP14+EGFR - CBC with Differential/Platelet  5. Hypothyroidism, unspecified type - CMP14+EGFR - CBC with Differential/Platelet - TSH  6. Colon cancer screening - Cologuard - CMP14+EGFR - CBC with Differential/Platelet  7. Encounter for screening for malignant neoplasm of rectum - Cologuard - CMP14+EGFR - CBC with Differential/Platelet   Labs pending Health Maintenance reviewed Diet and exercise encouraged  Follow up plan: 6 months    Evelina Dun, FNP

## 2019-12-04 NOTE — Patient Instructions (Signed)
Hypothyroidism  Hypothyroidism is when the thyroid gland does not make enough of certain hormones (it is underactive). The thyroid gland is a small gland located in the lower front part of the neck, just in front of the windpipe (trachea). This gland makes hormones that help control how the body uses food for energy (metabolism) as well as how the heart and brain function. These hormones also play a role in keeping your bones strong. When the thyroid is underactive, it produces too little of the hormones thyroxine (T4) and triiodothyronine (T3). What are the causes? This condition may be caused by:  Hashimoto's disease. This is a disease in which the body's disease-fighting system (immune system) attacks the thyroid gland. This is the most common cause.  Viral infections.  Pregnancy.  Certain medicines.  Birth defects.  Past radiation treatments to the head or neck for cancer.  Past treatment with radioactive iodine.  Past exposure to radiation in the environment.  Past surgical removal of part or all of the thyroid.  Problems with a gland in the center of the brain (pituitary gland).  Lack of enough iodine in the diet. What increases the risk? You are more likely to develop this condition if:  You are male.  You have a family history of thyroid conditions.  You use a medicine called lithium.  You take medicines that affect the immune system (immunosuppressants). What are the signs or symptoms? Symptoms of this condition include:  Feeling as though you have no energy (lethargy).  Not being able to tolerate cold.  Weight gain that is not explained by a change in diet or exercise habits.  Lack of appetite.  Dry skin.  Coarse hair.  Menstrual irregularity.  Slowing of thought processes.  Constipation.  Sadness or depression. How is this diagnosed? This condition may be diagnosed based on:  Your symptoms, your medical history, and a physical exam.  Blood  tests. You may also have imaging tests, such as an ultrasound or MRI. How is this treated? This condition is treated with medicine that replaces the thyroid hormones that your body does not make. After you begin treatment, it may take several weeks for symptoms to go away. Follow these instructions at home:  Take over-the-counter and prescription medicines only as told by your health care provider.  If you start taking any new medicines, tell your health care provider.  Keep all follow-up visits as told by your health care provider. This is important. ? As your condition improves, your dosage of thyroid hormone medicine may change. ? You will need to have blood tests regularly so that your health care provider can monitor your condition. Contact a health care provider if:  Your symptoms do not get better with treatment.  You are taking thyroid replacement medicine and you: ? Sweat a lot. ? Have tremors. ? Feel anxious. ? Lose weight rapidly. ? Cannot tolerate heat. ? Have emotional swings. ? Have diarrhea. ? Feel weak. Get help right away if you have:  Chest pain.  An irregular heartbeat.  A rapid heartbeat.  Difficulty breathing. Summary  Hypothyroidism is when the thyroid gland does not make enough of certain hormones (it is underactive).  When the thyroid is underactive, it produces too little of the hormones thyroxine (T4) and triiodothyronine (T3).  The most common cause is Hashimoto's disease, a disease in which the body's disease-fighting system (immune system) attacks the thyroid gland. The condition can also be caused by viral infections, medicine, pregnancy, or past   radiation treatment to the head or neck.  Symptoms may include weight gain, dry skin, constipation, feeling as though you do not have energy, and not being able to tolerate cold.  This condition is treated with medicine to replace the thyroid hormones that your body does not make. This information  is not intended to replace advice given to you by your health care provider. Make sure you discuss any questions you have with your health care provider. Document Revised: 09/20/2017 Document Reviewed: 09/18/2017 Elsevier Patient Education  2020 [employer] Inc.  

## 2019-12-05 LAB — CBC WITH DIFFERENTIAL/PLATELET
Basophils Absolute: 0.1 10*3/uL (ref 0.0–0.2)
Basos: 1 %
EOS (ABSOLUTE): 0.2 10*3/uL (ref 0.0–0.4)
Eos: 5 %
Hematocrit: 47.2 % (ref 37.5–51.0)
Hemoglobin: 16.2 g/dL (ref 13.0–17.7)
Immature Grans (Abs): 0 10*3/uL (ref 0.0–0.1)
Immature Granulocytes: 0 %
Lymphocytes Absolute: 0.9 10*3/uL (ref 0.7–3.1)
Lymphs: 20 %
MCH: 32.6 pg (ref 26.6–33.0)
MCHC: 34.3 g/dL (ref 31.5–35.7)
MCV: 95 fL (ref 79–97)
Monocytes Absolute: 0.4 10*3/uL (ref 0.1–0.9)
Monocytes: 8 %
Neutrophils Absolute: 2.8 10*3/uL (ref 1.4–7.0)
Neutrophils: 66 %
Platelets: 241 10*3/uL (ref 150–450)
RBC: 4.97 x10E6/uL (ref 4.14–5.80)
RDW: 14.5 % (ref 11.6–15.4)
WBC: 4.3 10*3/uL (ref 3.4–10.8)

## 2019-12-05 LAB — LIPID PANEL
Chol/HDL Ratio: 2.2 ratio (ref 0.0–5.0)
Cholesterol, Total: 172 mg/dL (ref 100–199)
HDL: 80 mg/dL (ref >39)
LDL Chol Calc (NIH): 74 mg/dL (ref 0–99)
Triglycerides: 102 mg/dL (ref 0–149)
VLDL Cholesterol Cal: 18 mg/dL (ref 5–40)

## 2019-12-05 LAB — CMP14+EGFR
ALT: 21 IU/L (ref 0–44)
AST: 34 IU/L (ref 0–40)
Albumin/Globulin Ratio: 1.7 (ref 1.2–2.2)
Albumin: 4.9 g/dL (ref 4.0–5.0)
Alkaline Phosphatase: 76 IU/L (ref 39–117)
BUN/Creatinine Ratio: 10 (ref 9–20)
BUN: 16 mg/dL (ref 6–24)
Bilirubin Total: 0.5 mg/dL (ref 0.0–1.2)
CO2: 21 mmol/L (ref 20–29)
Calcium: 9.6 mg/dL (ref 8.7–10.2)
Chloride: 100 mmol/L (ref 96–106)
Creatinine, Ser: 1.61 mg/dL — ABNORMAL HIGH (ref 0.76–1.27)
GFR calc Af Amer: 57 mL/min/{1.73_m2} — ABNORMAL LOW (ref >59)
GFR calc non Af Amer: 49 mL/min/{1.73_m2} — ABNORMAL LOW (ref >59)
Globulin, Total: 2.9 g/dL (ref 1.5–4.5)
Glucose: 98 mg/dL (ref 65–99)
Potassium: 5 mmol/L (ref 3.5–5.2)
Sodium: 138 mmol/L (ref 134–144)
Total Protein: 7.8 g/dL (ref 6.0–8.5)

## 2019-12-05 LAB — PSA, TOTAL AND FREE
PSA, Free Pct: 80 %
PSA, Free: 0.08 ng/mL
Prostate Specific Ag, Serum: 0.1 ng/mL (ref 0.0–4.0)

## 2019-12-05 LAB — TSH: TSH: 68 u[IU]/mL — ABNORMAL HIGH (ref 0.450–4.500)

## 2019-12-07 ENCOUNTER — Other Ambulatory Visit: Payer: Self-pay | Admitting: Family

## 2019-12-07 MED ORDER — LEVOTHYROXINE SODIUM 100 MCG PO TABS: 100.0000 ug | ORAL_TABLET | Freq: Every day | ORAL | 1 refills | Status: DC

## 2019-12-08 ENCOUNTER — Encounter: Payer: Self-pay | Admitting: *Deleted

## 2019-12-08 NOTE — Addendum Note (Signed)
Addended by: Jannifer Rodney A on: 12/08/2019 12:27 PM   Modules accepted: Orders

## 2020-02-05 ENCOUNTER — Ambulatory Visit: Payer: Managed Care, Other (non HMO) | Admitting: Family

## 2020-02-08 ENCOUNTER — Ambulatory Visit: Payer: Managed Care, Other (non HMO) | Admitting: Family

## 2020-02-23 ENCOUNTER — Encounter: Payer: Self-pay | Admitting: Family

## 2020-02-23 ENCOUNTER — Ambulatory Visit: Payer: Managed Care, Other (non HMO) | Admitting: Family

## 2020-02-23 ENCOUNTER — Other Ambulatory Visit: Payer: Self-pay

## 2020-02-23 VITALS — BP 131/86 | HR 76 | Temp 97.3°F | Ht 72.0 in | Wt 220.2 lb

## 2020-02-23 DIAGNOSIS — N1831 Chronic kidney disease, stage 3a: Secondary | ICD-10-CM | POA: Diagnosis not present

## 2020-02-23 DIAGNOSIS — E039 Hypothyroidism, unspecified: Secondary | ICD-10-CM | POA: Diagnosis not present

## 2020-02-23 NOTE — Patient Instructions (Signed)
Hypothyroidism  Hypothyroidism is when the thyroid gland does not make enough of certain hormones (it is underactive). The thyroid gland is a small gland located in the lower front part of the neck, just in front of the windpipe (trachea). This gland makes hormones that help control how the body uses food for energy (metabolism) as well as how the heart and brain function. These hormones also play a role in keeping your bones strong. When the thyroid is underactive, it produces too little of the hormones thyroxine (T4) and triiodothyronine (T3). What are the causes? This condition may be caused by:  Hashimoto's disease. This is a disease in which the body's disease-fighting system (immune system) attacks the thyroid gland. This is the most common cause.  Viral infections.  Pregnancy.  Certain medicines.  Birth defects.  Past radiation treatments to the head or neck for cancer.  Past treatment with radioactive iodine.  Past exposure to radiation in the environment.  Past surgical removal of part or all of the thyroid.  Problems with a gland in the center of the brain (pituitary gland).  Lack of enough iodine in the diet. What increases the risk? You are more likely to develop this condition if:  You are male.  You have a family history of thyroid conditions.  You use a medicine called lithium.  You take medicines that affect the immune system (immunosuppressants). What are the signs or symptoms? Symptoms of this condition include:  Feeling as though you have no energy (lethargy).  Not being able to tolerate cold.  Weight gain that is not explained by a change in diet or exercise habits.  Lack of appetite.  Dry skin.  Coarse hair.  Menstrual irregularity.  Slowing of thought processes.  Constipation.  Sadness or depression. How is this diagnosed? This condition may be diagnosed based on:  Your symptoms, your medical history, and a physical exam.  Blood  tests. You may also have imaging tests, such as an ultrasound or MRI. How is this treated? This condition is treated with medicine that replaces the thyroid hormones that your body does not make. After you begin treatment, it may take several weeks for symptoms to go away. Follow these instructions at home:  Take over-the-counter and prescription medicines only as told by your health care provider.  If you start taking any new medicines, tell your health care provider.  Keep all follow-up visits as told by your health care provider. This is important. ? As your condition improves, your dosage of thyroid hormone medicine may change. ? You will need to have blood tests regularly so that your health care provider can monitor your condition. Contact a health care provider if:  Your symptoms do not get better with treatment.  You are taking thyroid replacement medicine and you: ? Sweat a lot. ? Have tremors. ? Feel anxious. ? Lose weight rapidly. ? Cannot tolerate heat. ? Have emotional swings. ? Have diarrhea. ? Feel weak. Get help right away if you have:  Chest pain.  An irregular heartbeat.  A rapid heartbeat.  Difficulty breathing. Summary  Hypothyroidism is when the thyroid gland does not make enough of certain hormones (it is underactive).  When the thyroid is underactive, it produces too little of the hormones thyroxine (T4) and triiodothyronine (T3).  The most common cause is Hashimoto's disease, a disease in which the body's disease-fighting system (immune system) attacks the thyroid gland. The condition can also be caused by viral infections, medicine, pregnancy, or past   radiation treatment to the head or neck.  Symptoms may include weight gain, dry skin, constipation, feeling as though you do not have energy, and not being able to tolerate cold.  This condition is treated with medicine to replace the thyroid hormones that your body does not make. This information  is not intended to replace advice given to you by your health care provider. Make sure you discuss any questions you have with your health care provider. Document Revised: 09/20/2017 Document Reviewed: 09/18/2017 Elsevier Patient Education  2020 [employer] Inc.  

## 2020-02-23 NOTE — Progress Notes (Signed)
   Subjective:    Patient ID: Daniel Ellis, male    DOB: [birth year], 49 y.o.   MRN: [id number]  Chief Complaint  Patient presents with  . Medical Management of Chronic Issues   PT presents to the office today to recheck Thyroid and creatinine. He has an appointment with Nephrologists this month.  Thyroid Problem Presents for follow-up visit. Symptoms include weight loss. Patient reports no constipation, diarrhea, dry skin or fatigue. The symptoms have been stable.      Review of Systems  Constitutional: Positive for weight loss. Negative for fatigue.  Gastrointestinal: Negative for constipation and diarrhea.  All other systems reviewed and are negative.      Objective:   Physical Exam Vitals reviewed.  Constitutional:      General: He is not in acute distress.    Appearance: He is well-developed.  HENT:     Head: Normocephalic.     Right Ear: Tympanic membrane normal.     Left Ear: Tympanic membrane normal.  Eyes:     General:        Right eye: No discharge.        Left eye: No discharge.     Pupils: Pupils are equal, round, and reactive to light.  Neck:     Thyroid: No thyromegaly.  Cardiovascular:     Rate and Rhythm: Normal rate and regular rhythm.     Heart sounds: Normal heart sounds. No murmur.  Pulmonary:     Effort: Pulmonary effort is normal. No respiratory distress.     Breath sounds: Normal breath sounds. No wheezing.  Abdominal:     General: Bowel sounds are normal. There is no distension.     Palpations: Abdomen is soft.     Tenderness: There is no abdominal tenderness.  Musculoskeletal:        General: No tenderness. Normal range of motion.     Cervical back: Normal range of motion and neck supple.  Skin:    General: Skin is warm and dry.     Findings: No erythema or rash.  Neurological:     Mental Status: He is alert and oriented to person, place, and time.     Cranial Nerves: No cranial nerve deficit.     Deep Tendon Reflexes: Reflexes are  normal and symmetric.  Psychiatric:        Behavior: Behavior normal.        Thought Content: Thought content normal.        Judgment: Judgment normal.      BP 131/86   Pulse 76   Temp (!) 97.3 F (36.3 C) (Temporal)   Ht 6' (1.829 m)   Wt 220 lb 3.2 oz (99.9 kg)   SpO2 100%   BMI 29.86 kg/m       Assessment & Plan:  Daniel Ellis comes in today with chief complaint of Medical Management of Chronic Issues   Diagnosis and orders addressed:  1. Hypothyroidism, unspecified type - Thyroid Panel With TSH - BMP8+EGFR  2. Stage 3a chronic kidney disease - BMP8+EGFR   Labs pending Health Maintenance reviewed Diet and exercise encouraged  Follow up:  Depending if labs, if TSH still abnormal will need to return in 2 months. Keep Nephrologists appt. No NSAID's.   Evelina Dun, FNP

## 2020-02-24 LAB — THYROID PANEL WITH TSH
Free Thyroxine Index: 1.2 (ref 1.2–4.9)
T3 Uptake Ratio: 32 % (ref 24–39)
T4, Total: 3.9 ug/dL — ABNORMAL LOW (ref 4.5–12.0)
TSH: 12 u[IU]/mL — ABNORMAL HIGH (ref 0.450–4.500)

## 2020-02-24 LAB — BMP8+EGFR
BUN/Creatinine Ratio: 10 (ref 9–20)
BUN: 14 mg/dL (ref 6–24)
CO2: 23 mmol/L (ref 20–29)
Calcium: 9.8 mg/dL (ref 8.7–10.2)
Chloride: 104 mmol/L (ref 96–106)
Creatinine, Ser: 1.37 mg/dL — ABNORMAL HIGH (ref 0.76–1.27)
GFR calc Af Amer: 69 mL/min/{1.73_m2} (ref >59)
GFR calc non Af Amer: 60 mL/min/{1.73_m2} (ref >59)
Glucose: 106 mg/dL — ABNORMAL HIGH (ref 65–99)
Potassium: 5.3 mmol/L — ABNORMAL HIGH (ref 3.5–5.2)
Sodium: 143 mmol/L (ref 134–144)

## 2020-02-26 ENCOUNTER — Other Ambulatory Visit: Payer: Self-pay | Admitting: Family

## 2020-02-26 MED ORDER — LEVOTHYROXINE SODIUM 150 MCG PO TABS: 150.0000 ug | ORAL_TABLET | Freq: Every day | ORAL | 2 refills | Status: DC

## 2020-03-03 ENCOUNTER — Other Ambulatory Visit (HOSPITAL_COMMUNITY): Payer: Self-pay | Admitting: Nephrology

## 2020-03-03 ENCOUNTER — Other Ambulatory Visit: Payer: Self-pay | Admitting: Nephrology

## 2020-03-03 DIAGNOSIS — N1831 Chronic kidney disease, stage 3a: Secondary | ICD-10-CM

## 2020-03-03 DIAGNOSIS — I10 Essential (primary) hypertension: Secondary | ICD-10-CM

## 2020-03-03 DIAGNOSIS — D862 Sarcoidosis of lung with sarcoidosis of lymph nodes: Secondary | ICD-10-CM

## 2020-03-03 DIAGNOSIS — I129 Hypertensive chronic kidney disease with stage 1 through stage 4 chronic kidney disease, or unspecified chronic kidney disease: Secondary | ICD-10-CM

## 2020-03-03 DIAGNOSIS — K219 Gastro-esophageal reflux disease without esophagitis: Secondary | ICD-10-CM

## 2020-03-03 DIAGNOSIS — R600 Localized edema: Secondary | ICD-10-CM

## 2020-03-03 DIAGNOSIS — Z79899 Other long term (current) drug therapy: Secondary | ICD-10-CM

## 2020-03-03 DIAGNOSIS — R809 Proteinuria, unspecified: Secondary | ICD-10-CM

## 2020-03-16 ENCOUNTER — Ambulatory Visit (HOSPITAL_COMMUNITY)
Admission: RE | Admit: 2020-03-16 | Discharge: 2020-03-16 | Disposition: A | Discharge status: Home / Self Care | Payer: Managed Care, Other (non HMO) | Source: Ambulatory Visit | Attending: Family | Admitting: Family

## 2020-03-16 ENCOUNTER — Other Ambulatory Visit: Payer: Self-pay

## 2020-03-16 ENCOUNTER — Ambulatory Visit (HOSPITAL_COMMUNITY)
Admission: RE | Admit: 2020-03-16 | Discharge: 2020-03-16 | Disposition: A | Discharge status: Home / Self Care | Payer: Managed Care, Other (non HMO) | Source: Ambulatory Visit | Attending: Nephrology | Admitting: Nephrology

## 2020-03-16 DIAGNOSIS — Z79899 Other long term (current) drug therapy: Secondary | ICD-10-CM | POA: Insufficient documentation

## 2020-03-16 DIAGNOSIS — R809 Proteinuria, unspecified: Secondary | ICD-10-CM

## 2020-03-16 DIAGNOSIS — I517 Cardiomegaly: Secondary | ICD-10-CM | POA: Diagnosis not present

## 2020-03-16 DIAGNOSIS — K219 Gastro-esophageal reflux disease without esophagitis: Secondary | ICD-10-CM

## 2020-03-16 DIAGNOSIS — R600 Localized edema: Secondary | ICD-10-CM

## 2020-03-16 DIAGNOSIS — N1831 Chronic kidney disease, stage 3a: Secondary | ICD-10-CM | POA: Insufficient documentation

## 2020-03-16 DIAGNOSIS — D862 Sarcoidosis of lung with sarcoidosis of lymph nodes: Secondary | ICD-10-CM | POA: Insufficient documentation

## 2020-03-16 DIAGNOSIS — N200 Calculus of kidney: Secondary | ICD-10-CM | POA: Diagnosis not present

## 2020-03-16 DIAGNOSIS — I129 Hypertensive chronic kidney disease with stage 1 through stage 4 chronic kidney disease, or unspecified chronic kidney disease: Secondary | ICD-10-CM

## 2020-03-16 DIAGNOSIS — Z87891 Personal history of nicotine dependence: Secondary | ICD-10-CM | POA: Diagnosis not present

## 2020-03-16 DIAGNOSIS — R918 Other nonspecific abnormal finding of lung field: Secondary | ICD-10-CM | POA: Diagnosis not present

## 2020-03-16 DIAGNOSIS — I10 Essential (primary) hypertension: Secondary | ICD-10-CM

## 2020-03-16 NOTE — Progress Notes (Signed)
*  PRELIMINARY RESULTS* Echocardiogram 2D Echocardiogram has been performed.  Daniel Ellis 03/16/2020, 11:25 AM

## 2020-04-28 ENCOUNTER — Encounter: Payer: Self-pay | Admitting: Family

## 2020-04-28 ENCOUNTER — Ambulatory Visit: Payer: Managed Care, Other (non HMO) | Admitting: Family

## 2020-04-28 ENCOUNTER — Other Ambulatory Visit: Payer: Self-pay

## 2020-04-28 VITALS — BP 130/85 | HR 60 | Temp 97.4°F | Ht 72.0 in | Wt 213.0 lb

## 2020-04-28 DIAGNOSIS — E039 Hypothyroidism, unspecified: Secondary | ICD-10-CM | POA: Diagnosis not present

## 2020-04-28 NOTE — Patient Instructions (Signed)
Hypothyroidism  Hypothyroidism is when the thyroid gland does not make enough of certain hormones (it is underactive). The thyroid gland is a small gland located in the lower front part of the neck, just in front of the windpipe (trachea). This gland makes hormones that help control how the body uses food for energy (metabolism) as well as how the heart and brain function. These hormones also play a role in keeping your bones strong. When the thyroid is underactive, it produces too little of the hormones thyroxine (T4) and triiodothyronine (T3). What are the causes? This condition may be caused by:  Hashimoto's disease. This is a disease in which the body's disease-fighting system (immune system) attacks the thyroid gland. This is the most common cause.  Viral infections.  Pregnancy.  Certain medicines.  Birth defects.  Past radiation treatments to the head or neck for cancer.  Past treatment with radioactive iodine.  Past exposure to radiation in the environment.  Past surgical removal of part or all of the thyroid.  Problems with a gland in the center of the brain (pituitary gland).  Lack of enough iodine in the diet. What increases the risk? You are more likely to develop this condition if:  You are male.  You have a family history of thyroid conditions.  You use a medicine called lithium.  You take medicines that affect the immune system (immunosuppressants). What are the signs or symptoms? Symptoms of this condition include:  Feeling as though you have no energy (lethargy).  Not being able to tolerate cold.  Weight gain that is not explained by a change in diet or exercise habits.  Lack of appetite.  Dry skin.  Coarse hair.  Menstrual irregularity.  Slowing of thought processes.  Constipation.  Sadness or depression. How is this diagnosed? This condition may be diagnosed based on:  Your symptoms, your medical history, and a physical exam.  Blood  tests. You may also have imaging tests, such as an ultrasound or MRI. How is this treated? This condition is treated with medicine that replaces the thyroid hormones that your body does not make. After you begin treatment, it may take several weeks for symptoms to go away. Follow these instructions at home:  Take over-the-counter and prescription medicines only as told by your health care provider.  If you start taking any new medicines, tell your health care provider.  Keep all follow-up visits as told by your health care provider. This is important. ? As your condition improves, your dosage of thyroid hormone medicine may change. ? You will need to have blood tests regularly so that your health care provider can monitor your condition. Contact a health care provider if:  Your symptoms do not get better with treatment.  You are taking thyroid replacement medicine and you: ? Sweat a lot. ? Have tremors. ? Feel anxious. ? Lose weight rapidly. ? Cannot tolerate heat. ? Have emotional swings. ? Have diarrhea. ? Feel weak. Get help right away if you have:  Chest pain.  An irregular heartbeat.  A rapid heartbeat.  Difficulty breathing. Summary  Hypothyroidism is when the thyroid gland does not make enough of certain hormones (it is underactive).  When the thyroid is underactive, it produces too little of the hormones thyroxine (T4) and triiodothyronine (T3).  The most common cause is Hashimoto's disease, a disease in which the body's disease-fighting system (immune system) attacks the thyroid gland. The condition can also be caused by viral infections, medicine, pregnancy, or past   radiation treatment to the head or neck.  Symptoms may include weight gain, dry skin, constipation, feeling as though you do not have energy, and not being able to tolerate cold.  This condition is treated with medicine to replace the thyroid hormones that your body does not make. This information  is not intended to replace advice given to you by your health care provider. Make sure you discuss any questions you have with your health care provider. Document Revised: 09/20/2017 Document Reviewed: 09/18/2017 Elsevier Patient Education  2020 [employer] Inc.  

## 2020-04-28 NOTE — Progress Notes (Signed)
   Subjective:    Patient ID: Daniel Ellis, male    DOB: [birth year], 49 y.o.   MRN: [id number]  Chief Complaint  Patient presents with  . Hypothyroidism    2 mth rck    PT presents to the office today to recheck TSH. His  TSH on 12/04/19 was 68, then on 02/23/20 it had decreased to 12. He is currently taking levothyroxine 150 mcg. He reports he has lost 39 lb since 02/23/19 and reports he feels better since his weight loss.  Thyroid Problem Presents for follow-up visit. Symptoms include weight loss. Patient reports no anxiety, constipation, depressed mood, dry skin, fatigue or hoarse voice. The symptoms have been stable.      Review of Systems  Constitutional: Positive for weight loss. Negative for fatigue.  HENT: Negative for hoarse voice.   Gastrointestinal: Negative for constipation.  Psychiatric/Behavioral: The patient is not nervous/anxious.   All other systems reviewed and are negative.      Objective:   Physical Exam Vitals reviewed.  Constitutional:      General: He is not in acute distress.    Appearance: He is well-developed.  HENT:     Head: Normocephalic.  Eyes:     General:        Right eye: No discharge.        Left eye: No discharge.     Pupils: Pupils are equal, round, and reactive to light.  Neck:     Thyroid: No thyromegaly.  Cardiovascular:     Rate and Rhythm: Normal rate and regular rhythm.     Heart sounds: Normal heart sounds. No murmur heard.   Pulmonary:     Effort: Pulmonary effort is normal. No respiratory distress.     Breath sounds: Normal breath sounds. No wheezing.  Abdominal:     General: Bowel sounds are normal. There is no distension.     Palpations: Abdomen is soft.     Tenderness: There is no abdominal tenderness.  Musculoskeletal:        General: No tenderness. Normal range of motion.     Cervical back: Normal range of motion and neck supple.  Skin:    General: Skin is warm and dry.     Findings: No erythema or rash.    Neurological:     Mental Status: He is alert and oriented to person, place, and time.     Cranial Nerves: No cranial nerve deficit.     Deep Tendon Reflexes: Reflexes are normal and symmetric.  Psychiatric:        Behavior: Behavior normal.        Thought Content: Thought content normal.        Judgment: Judgment normal.       BP 130/85   Pulse 60   Temp (!) 97.4 F (36.3 C) (Temporal)   Ht 6' (1.829 m)   Wt 213 lb (96.6 kg)   BMI 28.89 kg/m      Assessment & Plan:  Daniel Ellis comes in today with chief complaint of Hypothyroidism (2 mth rck )   Diagnosis and orders addressed:  1. Hypothyroidism, unspecified type - Thyroid Panel With TSH   Labs pending Health Maintenance reviewed Diet and exercise encouraged  Follow up plan: 6 months   Jannifer Rodney, FNP

## 2020-04-29 LAB — THYROID PANEL WITH TSH
Free Thyroxine Index: 1.5 (ref 1.2–4.9)
T3 Uptake Ratio: 34 % (ref 24–39)
T4, Total: 4.4 ug/dL — ABNORMAL LOW (ref 4.5–12.0)
TSH: 2.03 u[IU]/mL (ref 0.450–4.500)

## 2020-05-04 ENCOUNTER — Telehealth: Payer: Self-pay | Admitting: Family

## 2020-05-04 NOTE — Telephone Encounter (Signed)
FYI: Pt called to get most recent lab results. Reviewed results with pt per Christys notes. Pt voiced understanding. Pt scheduled his 6 month ck on 10/28/20.

## 2020-10-28 ENCOUNTER — Ambulatory Visit: Payer: Managed Care, Other (non HMO) | Admitting: Family

## 2020-11-17 ENCOUNTER — Ambulatory Visit: Payer: Managed Care, Other (non HMO) | Admitting: Family

## 2020-11-21 ENCOUNTER — Other Ambulatory Visit: Payer: Self-pay | Admitting: Family

## 2020-12-07 IMAGING — US US RENAL
1 series · 14 of 25 positions shown · non-contrast
Comparison: Prior CT from 06/10/2010.

CLINICAL DATA: Initial evaluation for stage III A chronic kidney
disease.

EXAM:
RENAL / URINARY TRACT ULTRASOUND COMPLETE

[Series 1: us renal · 14 of 39 slices shown]
[im 1/39]
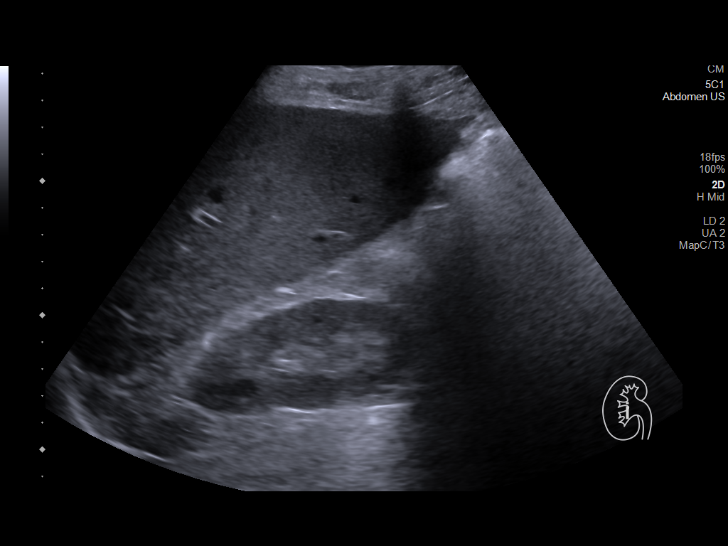
[im 4/39]
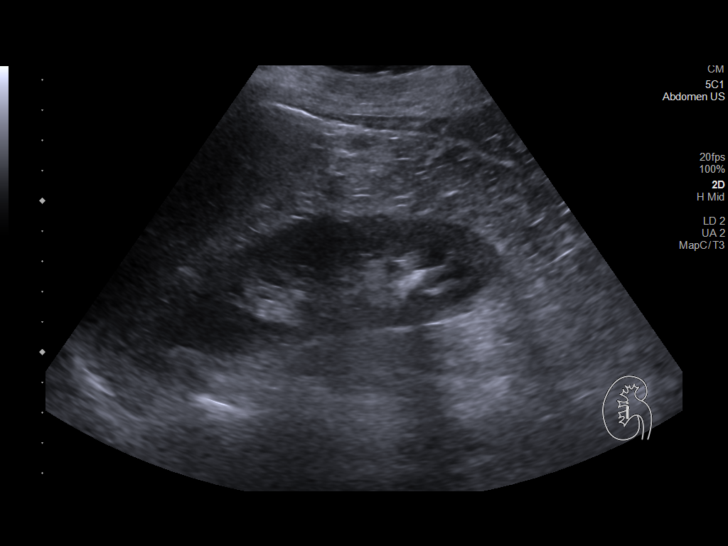
[im 7/39]
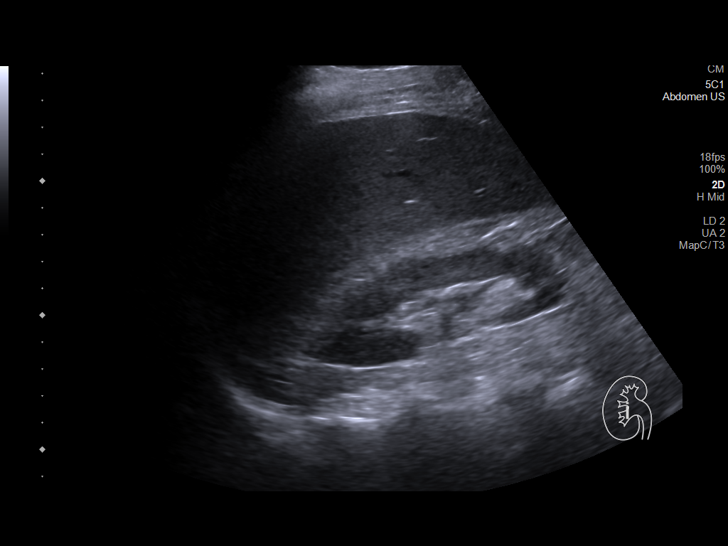
[im 10/39]
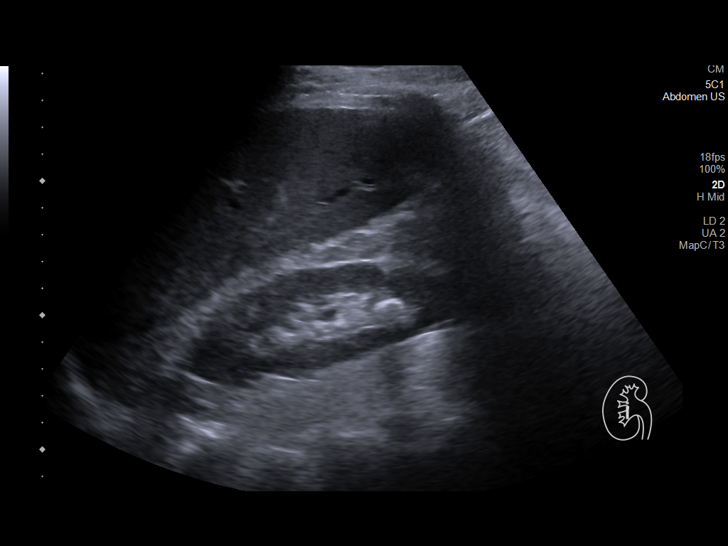
[im 13/39]
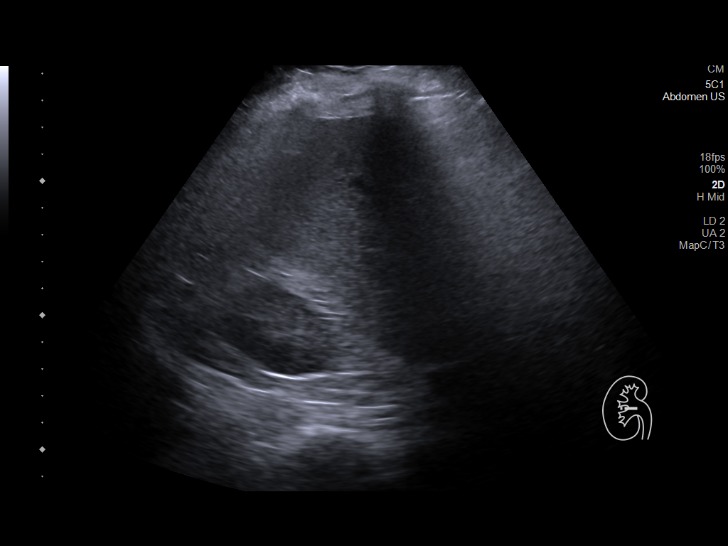
[im 15/39]
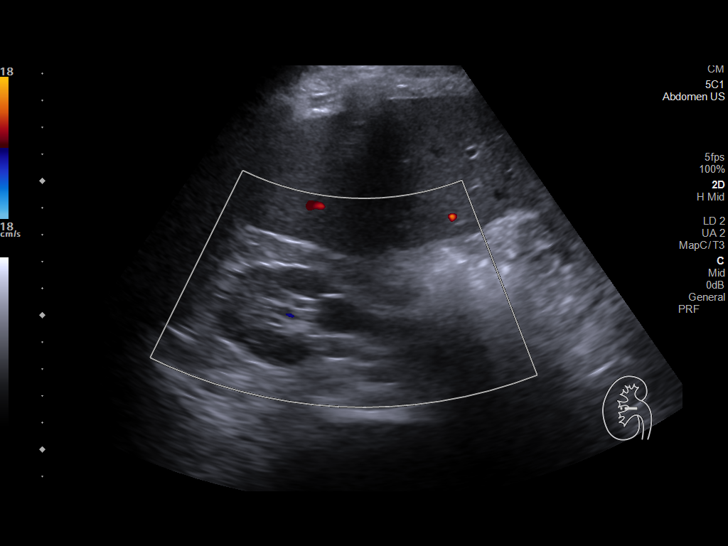
[im 18/39]
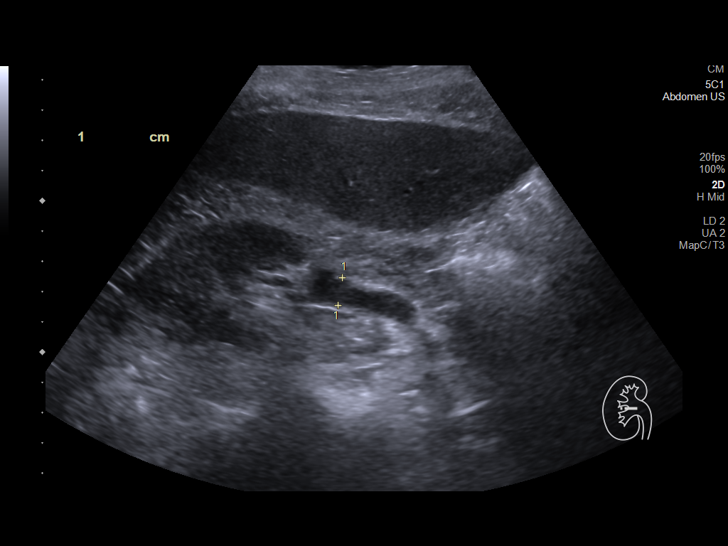
[im 21/39]
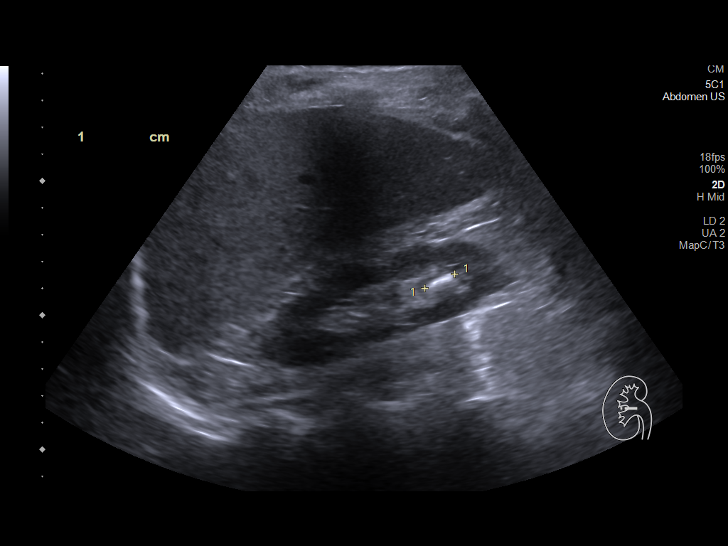
[im 24/39]
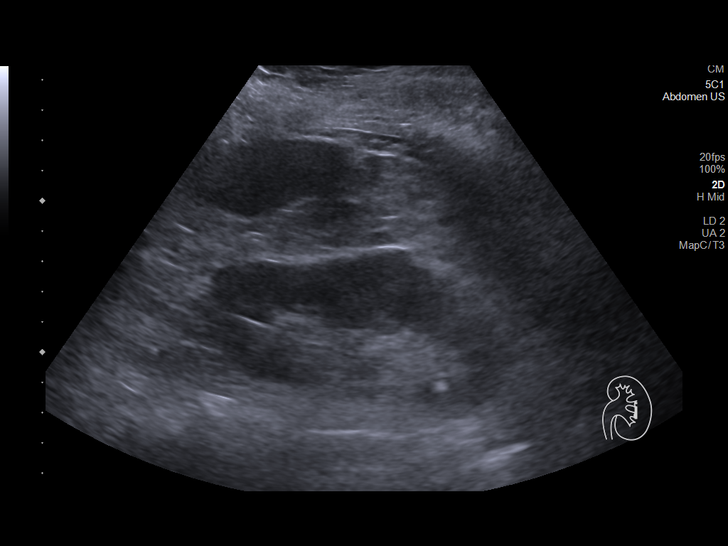
[im 26/39]
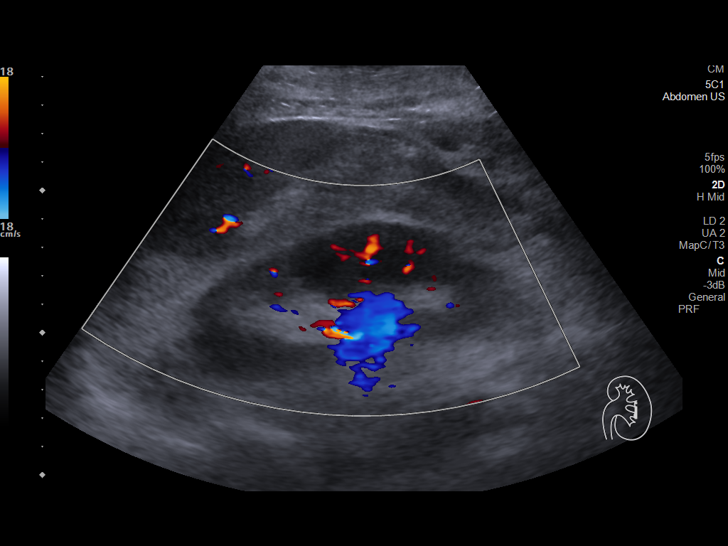
[im 29/39]
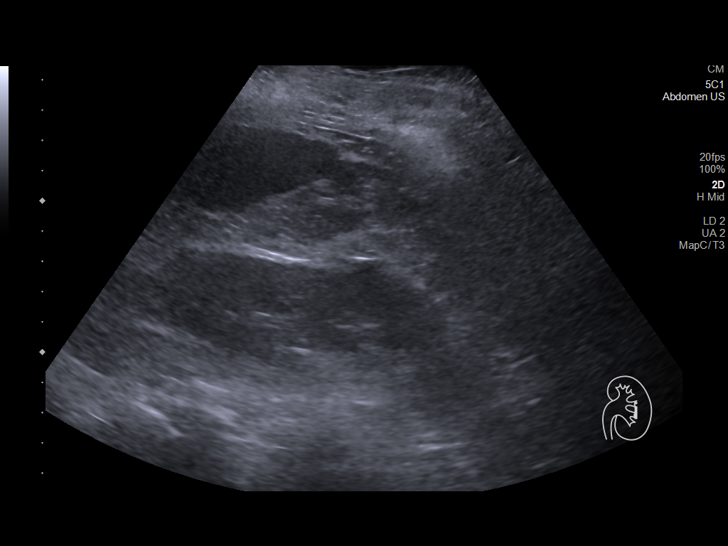
[im 32/39]
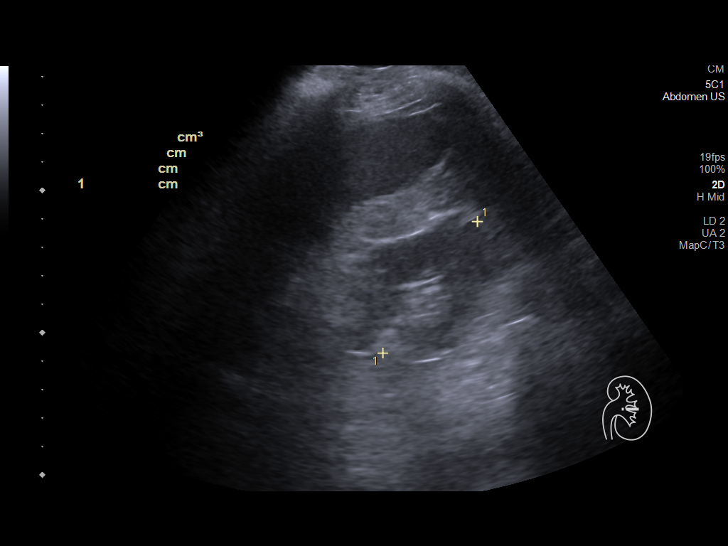
[im 35/39]
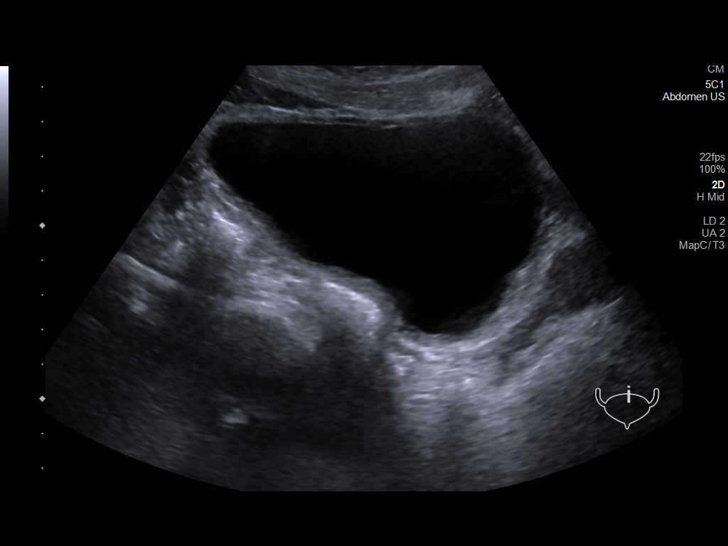
[im 39/39]
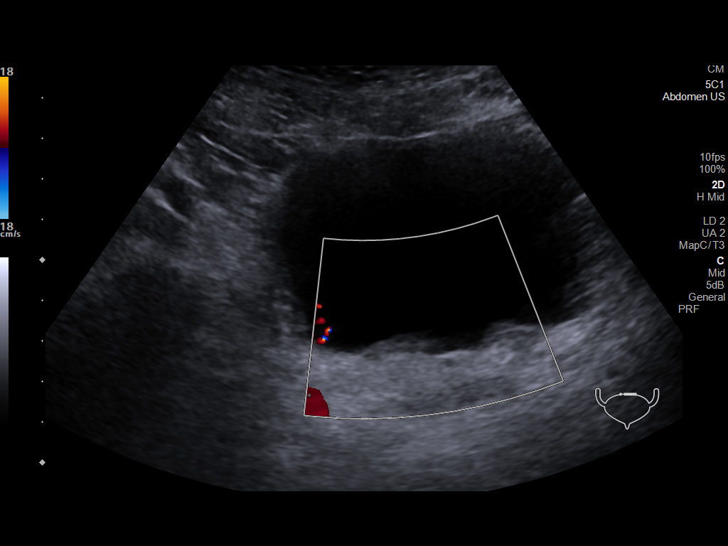

[14 of 25 positions shown; findings below may reference images not displayed]

FINDINGS: Right Kidney:

Renal measurements: 10.2 x 4.1 x 4.9 cm = volume: 108.3 mL.
Echogenicity within normal limits. 1.2 cm shadowing echogenic stone
present at the lower pole. Probable small extrarenal pelvis without
frank hydronephrosis. No focal renal mass.

Left Kidney:

Renal measurements: 11.0 x 4.2 x 5.7 cm = volume: 136 mL.
Echogenicity within normal limits. No nephrolithiasis or
hydronephrosis. No focal renal mass.

Bladder:

Appears normal for degree of bladder distention.

Other:

None.
IMPRESSION: 1. Normal renal echogenicity.
2. 1.2 cm nonobstructive right renal calculus. Superimposed small
right extra renal pelvis without hydronephrosis.

## 2020-12-09 ENCOUNTER — Ambulatory Visit: Payer: Managed Care, Other (non HMO) | Admitting: Family

## 2020-12-21 ENCOUNTER — Other Ambulatory Visit: Payer: Self-pay | Admitting: Family

## 2020-12-22 ENCOUNTER — Other Ambulatory Visit: Payer: Self-pay | Admitting: Family

## 2021-01-03 ENCOUNTER — Encounter: Payer: Self-pay | Admitting: Family

## 2021-01-03 ENCOUNTER — Ambulatory Visit: Payer: Managed Care, Other (non HMO) | Admitting: Family

## 2021-01-03 ENCOUNTER — Other Ambulatory Visit: Payer: Self-pay

## 2021-01-03 VITALS — BP 130/85 | HR 68 | Temp 97.2°F | Ht 72.0 in | Wt 203.2 lb

## 2021-01-03 DIAGNOSIS — F411 Generalized anxiety disorder: Secondary | ICD-10-CM

## 2021-01-03 DIAGNOSIS — E039 Hypothyroidism, unspecified: Secondary | ICD-10-CM

## 2021-01-03 DIAGNOSIS — Z0000 Encounter for general adult medical examination without abnormal findings: Secondary | ICD-10-CM

## 2021-01-03 DIAGNOSIS — K219 Gastro-esophageal reflux disease without esophagitis: Secondary | ICD-10-CM

## 2021-01-03 DIAGNOSIS — I10 Essential (primary) hypertension: Secondary | ICD-10-CM

## 2021-01-03 DIAGNOSIS — H6191 Disorder of right external ear, unspecified: Secondary | ICD-10-CM

## 2021-01-03 DIAGNOSIS — Z1211 Encounter for screening for malignant neoplasm of colon: Secondary | ICD-10-CM

## 2021-01-03 DIAGNOSIS — Z1159 Encounter for screening for other viral diseases: Secondary | ICD-10-CM

## 2021-01-03 DIAGNOSIS — Z0001 Encounter for general adult medical examination with abnormal findings: Secondary | ICD-10-CM | POA: Diagnosis not present

## 2021-01-03 MED ORDER — PAROXETINE HCL 20 MG PO TABS: 20.0000 mg | ORAL_TABLET | Freq: Every day | ORAL | 1 refills | Status: DC

## 2021-01-03 MED ORDER — LEVOTHYROXINE SODIUM 150 MCG PO TABS: ORAL_TABLET | ORAL | 2 refills | Status: DC

## 2021-01-03 NOTE — Patient Instructions (Signed)

## 2021-01-03 NOTE — Progress Notes (Signed)
Subjective:    Patient ID: Daniel Ellis, male    DOB: 1971-05-16, 50 y.o.   MRN: [id number]  Chief Complaint  Patient presents with  . Hypothyroidism    6 mth no concerns    Pt presents to the office today for CPE.  Hypertension This is a chronic problem. The current episode started more than 1 year ago. The problem has been resolved since onset. The problem is controlled. Pertinent negatives include no malaise/fatigue, peripheral edema or shortness of breath. Risk factors for coronary artery disease include dyslipidemia and obesity. The current treatment provides moderate improvement. Identifiable causes of hypertension include a thyroid problem.  Thyroid Problem Presents for follow-up visit. Patient reports no anxiety, depressed mood, diarrhea, fatigue or hoarse voice. The symptoms have been stable.  Gastroesophageal Reflux He complains of belching and heartburn. He reports no hoarse voice. This is a chronic problem. The current episode started more than 1 year ago. The problem occurs occasionally. Pertinent negatives include no fatigue. He has tried a PPI for the symptoms. The treatment provided moderate relief.      Review of Systems  Constitutional: Negative for fatigue and malaise/fatigue.  HENT: Negative for hoarse voice.   Respiratory: Negative for shortness of breath.   Gastrointestinal: Positive for heartburn. Negative for diarrhea.  Psychiatric/Behavioral: The patient is not nervous/anxious.   All other systems reviewed and are negative.  Family History  Problem Relation Age of Onset  . Diabetes Father    Social History   Socioeconomic History  . Marital status: Married    Spouse name: Not on file  . Number of children: Not on file  . Years of education: Not on file  . Highest education level: Not on file  Occupational History  . Not on file  Tobacco Use  . Smoking status: Former Smoker    Packs/day: 0.75    Years: 10.00    Pack years: 7.50    Types:  Cigarettes    Quit date: 10/19/1996    Years since quitting: 24.2  . Smokeless tobacco: Current User    Types: Snuff  Substance and Sexual Activity  . Alcohol use: Yes    Comment: rarely  . Drug use: No  . Sexual activity: Not on file  Other Topics Concern  . Not on file  Social History Narrative  . Not on file   Social Determinants of Health   Financial Resource Strain: Not on file  Food Insecurity: Not on file  Transportation Needs: Not on file  Physical Activity: Not on file  Stress: Not on file  Social Connections: Not on file       Objective:   Physical Exam Vitals reviewed.  Constitutional:      General: He is not in acute distress.    Appearance: He is well-developed.  HENT:     Head: Normocephalic.      Comments: Scabbed skin lesion on right right ear.     Right Ear: Tympanic membrane normal.     Left Ear: Tympanic membrane normal.  Eyes:     General:        Right eye: No discharge.        Left eye: No discharge.     Pupils: Pupils are equal, round, and reactive to light.  Neck:     Thyroid: No thyromegaly.  Cardiovascular:     Rate and Rhythm: Normal rate and regular rhythm.     Heart sounds: Normal heart sounds. No murmur heard.  Pulmonary:     Effort: Pulmonary effort is normal. No respiratory distress.     Breath sounds: Normal breath sounds. No wheezing.  Abdominal:     General: Bowel sounds are normal. There is no distension.     Palpations: Abdomen is soft.     Tenderness: There is no abdominal tenderness.  Musculoskeletal:        General: No tenderness. Normal range of motion.     Cervical back: Normal range of motion and neck supple.  Skin:    General: Skin is warm and dry.     Findings: No erythema or rash.  Neurological:     Mental Status: He is alert and oriented to person, place, and time.     Cranial Nerves: No cranial nerve deficit.     Deep Tendon Reflexes: Reflexes are normal and symmetric.  Psychiatric:        Behavior:  Behavior normal.        Thought Content: Thought content normal.        Judgment: Judgment normal.       BP 130/85   Pulse 68   Temp (!) 97.2 F (36.2 C) (Temporal)   Ht 6' (1.829 m)   Wt 203 lb 3.2 oz (92.2 kg)   BMI 27.56 kg/m      Assessment & Plan:  Daniel Ellis comes in today with chief complaint of Hypothyroidism (6 mth no concerns )   Diagnosis and orders addressed:  1. Essential hypertension - CMP14+EGFR - CBC with Differential/Platelet  2. Gastroesophageal reflux disease, unspecified whether esophagitis present - CMP14+EGFR - CBC with Differential/Platelet  3. Hypothyroidism, unspecified type - levothyroxine (EUTHYROX) 150 MCG tablet; TAKE 1 TABLET BY MOUTH ONCE DAILY BEFORE BREAKFAST . APPOINTMENT REQUIRED FOR FUTURE REFILLS  Dispense: 90 tablet; Refill: 2 - CMP14+EGFR - CBC with Differential/Platelet - TSH  4. GAD (generalized anxiety disorder) - PARoxetine (PAXIL) 20 MG tablet; Take 1 tablet (20 mg total) by mouth daily.  Dispense: 90 tablet; Refill: 1 - CMP14+EGFR - CBC with Differential/Platelet  5. Annual physical exam - Hepatitis C antibody - Cologuard - CMP14+EGFR - CBC with Differential/Platelet - Lipid panel - TSH - PSA, total and free  6. Colon cancer screening - Cologuard - CMP14+EGFR - CBC with Differential/Platelet  7. Need for hepatitis C screening test - Hepatitis C antibody - CMP14+EGFR - CBC with Differential/Platelet  8. Skin lesion of right ear Pt will call dermatologists to make an appointment     Labs pending Health Maintenance reviewed Diet and exercise encouraged  Follow up plan: 6 months   Evelina Dun, FNP

## 2021-01-04 LAB — LIPID PANEL
Chol/HDL Ratio: 1.6 ratio (ref 0.0–5.0)
Cholesterol, Total: 154 mg/dL (ref 100–199)
HDL: 97 mg/dL (ref >39)
LDL Chol Calc (NIH): 46 mg/dL (ref 0–99)
Triglycerides: 50 mg/dL (ref 0–149)
VLDL Cholesterol Cal: 11 mg/dL (ref 5–40)

## 2021-01-04 LAB — CBC WITH DIFFERENTIAL/PLATELET
Basophils Absolute: 0 10*3/uL (ref 0.0–0.2)
Basos: 1 %
EOS (ABSOLUTE): 0.2 10*3/uL (ref 0.0–0.4)
Eos: 4 %
Hematocrit: 46.9 % (ref 37.5–51.0)
Hemoglobin: 15.5 g/dL (ref 13.0–17.7)
Immature Grans (Abs): 0 10*3/uL (ref 0.0–0.1)
Immature Granulocytes: 0 %
Lymphocytes Absolute: 0.9 10*3/uL (ref 0.7–3.1)
Lymphs: 20 %
MCH: 31.2 pg (ref 26.6–33.0)
MCHC: 33 g/dL (ref 31.5–35.7)
MCV: 94 fL (ref 79–97)
Monocytes Absolute: 0.6 10*3/uL (ref 0.1–0.9)
Monocytes: 12 %
Neutrophils Absolute: 2.9 10*3/uL (ref 1.4–7.0)
Neutrophils: 63 %
Platelets: 266 10*3/uL (ref 150–450)
RBC: 4.97 x10E6/uL (ref 4.14–5.80)
RDW: 12.5 % (ref 11.6–15.4)
WBC: 4.6 10*3/uL (ref 3.4–10.8)

## 2021-01-04 LAB — CMP14+EGFR
ALT: 22 IU/L (ref 0–44)
AST: 21 IU/L (ref 0–40)
Albumin/Globulin Ratio: 1.5 (ref 1.2–2.2)
Albumin: 4.8 g/dL (ref 3.8–4.9)
Alkaline Phosphatase: 99 IU/L (ref 44–121)
BUN/Creatinine Ratio: 15 (ref 9–20)
BUN: 16 mg/dL (ref 6–24)
Bilirubin Total: 0.5 mg/dL (ref 0.0–1.2)
CO2: 25 mmol/L (ref 20–29)
Calcium: 10.4 mg/dL — ABNORMAL HIGH (ref 8.7–10.2)
Chloride: 98 mmol/L (ref 96–106)
Creatinine, Ser: 1.06 mg/dL (ref 0.76–1.27)
Globulin, Total: 3.3 g/dL (ref 1.5–4.5)
Glucose: 106 mg/dL — ABNORMAL HIGH (ref 65–99)
Potassium: 5.4 mmol/L — ABNORMAL HIGH (ref 3.5–5.2)
Sodium: 141 mmol/L (ref 134–144)
Total Protein: 8.1 g/dL (ref 6.0–8.5)
eGFR: 85 mL/min/{1.73_m2} (ref >59)

## 2021-01-04 LAB — HEPATITIS C ANTIBODY: Hep C Virus Ab: 0.1 s/co ratio (ref 0.0–0.9)

## 2021-01-04 LAB — PSA, TOTAL AND FREE
PSA, Free Pct: 63.3 %
PSA, Free: 0.19 ng/mL
Prostate Specific Ag, Serum: 0.3 ng/mL (ref 0.0–4.0)

## 2021-01-04 LAB — TSH: TSH: 3.39 u[IU]/mL (ref 0.450–4.500)

## 2021-01-05 ENCOUNTER — Other Ambulatory Visit: Payer: Self-pay | Admitting: Family

## 2021-01-05 ENCOUNTER — Other Ambulatory Visit: Payer: Self-pay | Admitting: Family Medicine

## 2021-01-05 DIAGNOSIS — E875 Hyperkalemia: Secondary | ICD-10-CM

## 2021-01-05 NOTE — Progress Notes (Signed)
Lmtcb.

## 2021-10-23 ENCOUNTER — Other Ambulatory Visit: Payer: Self-pay | Admitting: Family

## 2021-10-23 DIAGNOSIS — E039 Hypothyroidism, unspecified: Secondary | ICD-10-CM

## 2021-10-24 ENCOUNTER — Encounter: Payer: Self-pay | Admitting: Family

## 2021-10-24 NOTE — Telephone Encounter (Signed)
I called pt to leave a message to make a f/u ckup before any more refills.

## 2021-10-24 NOTE — Telephone Encounter (Signed)
Thyroid med sent in for 30 days - will need appt for further refills

## 2021-11-13 ENCOUNTER — Encounter: Payer: Self-pay | Admitting: Family

## 2021-11-13 ENCOUNTER — Ambulatory Visit (INDEPENDENT_AMBULATORY_CARE_PROVIDER_SITE_OTHER): Payer: Managed Care, Other (non HMO) | Admitting: Family

## 2021-11-13 ENCOUNTER — Ambulatory Visit (INDEPENDENT_AMBULATORY_CARE_PROVIDER_SITE_OTHER): Payer: Managed Care, Other (non HMO)

## 2021-11-13 VITALS — BP 137/87 | HR 78 | Temp 97.7°F | Ht 72.0 in | Wt 204.2 lb

## 2021-11-13 DIAGNOSIS — D862 Sarcoidosis of lung with sarcoidosis of lymph nodes: Secondary | ICD-10-CM | POA: Diagnosis not present

## 2021-11-13 DIAGNOSIS — K219 Gastro-esophageal reflux disease without esophagitis: Secondary | ICD-10-CM | POA: Diagnosis not present

## 2021-11-13 DIAGNOSIS — E039 Hypothyroidism, unspecified: Secondary | ICD-10-CM

## 2021-11-13 DIAGNOSIS — F411 Generalized anxiety disorder: Secondary | ICD-10-CM

## 2021-11-13 DIAGNOSIS — I10 Essential (primary) hypertension: Secondary | ICD-10-CM | POA: Diagnosis not present

## 2021-11-13 MED ORDER — ESCITALOPRAM OXALATE 10 MG PO TABS: ORAL_TABLET | ORAL | 5 refills | Status: DC

## 2021-11-13 MED ORDER — ESCITALOPRAM OXALATE 20 MG PO TABS: 20.0000 mg | ORAL_TABLET | Freq: Every day | ORAL | 5 refills | Status: DC

## 2021-11-13 NOTE — Patient Instructions (Signed)

## 2021-11-13 NOTE — Progress Notes (Signed)
Subjective:    Patient ID: Daniel Ellis, male    DOB: 24-Mar-1971, 51 y.o.   MRN: [id number]  Chief Complaint  Patient presents with   Medical Management of Chronic Issues   PT presents to the office today for chronic follow up. Pt has hx of pulmonary infiltrate and possibility of sarcoidosis. Has not seen Pulmonologist since 2019.  Hypertension This is a chronic problem. The current episode started more than 1 year ago. The problem has been resolved since onset. The problem is controlled. Associated symptoms include anxiety. Pertinent negatives include no malaise/fatigue, peripheral edema or shortness of breath. Risk factors for coronary artery disease include dyslipidemia, obesity and male gender. The current treatment provides moderate improvement. Identifiable causes of hypertension include a thyroid problem.  Gastroesophageal Reflux He complains of belching and heartburn. This is a chronic problem. The current episode started more than 1 year ago. The problem occurs occasionally. Pertinent negatives include no fatigue. He has tried a PPI for the symptoms. The treatment provided moderate relief.  Thyroid Problem Presents for follow-up visit. Patient reports no anxiety, cold intolerance, depressed mood, fatigue or visual change. The symptoms have been stable.  Anxiety Presents for follow-up visit. Patient reports no depressed mood, excessive worry, irritability, nervous/anxious behavior or shortness of breath. Symptoms occur most days. The severity of symptoms is moderate.       Review of Systems  Constitutional:  Negative for fatigue, irritability and malaise/fatigue.  Respiratory:  Negative for shortness of breath.   Gastrointestinal:  Positive for heartburn.  Endocrine: Negative for cold intolerance.  Psychiatric/Behavioral:  The patient is not nervous/anxious.   All other systems reviewed and are negative.     Objective:   Physical Exam Vitals reviewed.  Constitutional:       General: He is not in acute distress.    Appearance: He is well-developed.  HENT:     Head: Normocephalic.     Right Ear: Tympanic membrane normal.     Left Ear: Tympanic membrane normal.  Eyes:     General:        Right eye: No discharge.        Left eye: No discharge.     Pupils: Pupils are equal, round, and reactive to light.  Neck:     Thyroid: No thyromegaly.  Cardiovascular:     Rate and Rhythm: Normal rate and regular rhythm.     Heart sounds: Normal heart sounds. No murmur heard. Pulmonary:     Effort: Pulmonary effort is normal. No respiratory distress.     Breath sounds: Normal breath sounds. No wheezing.  Abdominal:     General: Bowel sounds are normal. There is no distension.     Palpations: Abdomen is soft.     Tenderness: There is no abdominal tenderness.  Musculoskeletal:        General: No tenderness. Normal range of motion.     Cervical back: Normal range of motion and neck supple.  Skin:    General: Skin is warm and dry.     Findings: No erythema or rash.  Neurological:     Mental Status: He is alert and oriented to person, place, and time.     Cranial Nerves: No cranial nerve deficit.     Deep Tendon Reflexes: Reflexes are normal and symmetric.  Psychiatric:        Behavior: Behavior normal.        Thought Content: Thought content normal.  Judgment: Judgment normal.      BP 137/87    Pulse 78    Temp 97.7 F (36.5 C) (Temporal)    Ht 6' (1.829 m)    Wt 204 lb 3.2 oz (92.6 kg)    BMI 27.69 kg/m      Assessment & Plan:  SEWARD CORAN comes in today with chief complaint of Medical Management of Chronic Issues   Diagnosis and orders addressed:  1. Essential hypertension - CMP14+EGFR - CBC with Differential/Platelet  2. Gastroesophageal reflux disease, unspecified whether esophagitis present - CMP14+EGFR - CBC with Differential/Platelet  3. GAD (generalized anxiety disorder) -Start Lexapro 10 mg for two weeks then increase to 20 mg   Stress management  RTO in 6 weeks  - escitalopram (LEXAPRO) 10 MG tablet; Take 1 tablet (10 mg total) by mouth daily for 14 days, THEN 2 tablets (20 mg total) daily for 14 days.  Dispense: 30 tablet; Refill: 5 - escitalopram (LEXAPRO) 20 MG tablet; Take 1 tablet (20 mg total) by mouth daily.  Dispense: 30 tablet; Refill: 5 - CMP14+EGFR - CBC with Differential/Platelet  4. Hypothyroidism, unspecified type - CMP14+EGFR - CBC with Differential/Platelet - TSH  5. 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

## 2021-11-14 LAB — CMP14+EGFR
ALT: 19 IU/L (ref 0–44)
AST: 18 IU/L (ref 0–40)
Albumin/Globulin Ratio: 1.6 (ref 1.2–2.2)
Albumin: 4.6 g/dL (ref 3.8–4.9)
Alkaline Phosphatase: 92 IU/L (ref 44–121)
BUN/Creatinine Ratio: 19 (ref 9–20)
BUN: 20 mg/dL (ref 6–24)
Bilirubin Total: 0.4 mg/dL (ref 0.0–1.2)
CO2: 22 mmol/L (ref 20–29)
Calcium: 9.6 mg/dL (ref 8.7–10.2)
Chloride: 104 mmol/L (ref 96–106)
Creatinine, Ser: 1.07 mg/dL (ref 0.76–1.27)
Globulin, Total: 2.8 g/dL (ref 1.5–4.5)
Glucose: 109 mg/dL — ABNORMAL HIGH (ref 70–99)
Potassium: 4.3 mmol/L (ref 3.5–5.2)
Sodium: 138 mmol/L (ref 134–144)
Total Protein: 7.4 g/dL (ref 6.0–8.5)
eGFR: 83 mL/min/{1.73_m2} (ref >59)

## 2021-11-14 LAB — TSH: TSH: 2.85 u[IU]/mL (ref 0.450–4.500)

## 2021-11-14 LAB — CBC WITH DIFFERENTIAL/PLATELET
Basophils Absolute: 0 10*3/uL (ref 0.0–0.2)
Basos: 0 %
EOS (ABSOLUTE): 0.2 10*3/uL (ref 0.0–0.4)
Eos: 3 %
Hematocrit: 45.2 % (ref 37.5–51.0)
Hemoglobin: 15.6 g/dL (ref 13.0–17.7)
Immature Grans (Abs): 0 10*3/uL (ref 0.0–0.1)
Immature Granulocytes: 0 %
Lymphocytes Absolute: 1 10*3/uL (ref 0.7–3.1)
Lymphs: 21 %
MCH: 32 pg (ref 26.6–33.0)
MCHC: 34.5 g/dL (ref 31.5–35.7)
MCV: 93 fL (ref 79–97)
Monocytes Absolute: 0.5 10*3/uL (ref 0.1–0.9)
Monocytes: 10 %
Neutrophils Absolute: 3 10*3/uL (ref 1.4–7.0)
Neutrophils: 66 %
Platelets: 250 10*3/uL (ref 150–450)
RBC: 4.87 x10E6/uL (ref 4.14–5.80)
RDW: 12.5 % (ref 11.6–15.4)
WBC: 4.6 10*3/uL (ref 3.4–10.8)

## 2021-11-15 ENCOUNTER — Ambulatory Visit (INDEPENDENT_AMBULATORY_CARE_PROVIDER_SITE_OTHER): Payer: Managed Care, Other (non HMO) | Admitting: Nurse Practitioner

## 2021-11-15 ENCOUNTER — Encounter: Payer: Self-pay | Admitting: Nurse Practitioner

## 2021-11-15 DIAGNOSIS — Z024 Encounter for examination for driving license: Secondary | ICD-10-CM | POA: Diagnosis not present

## 2021-11-15 LAB — URINALYSIS
Bilirubin, UA: NEGATIVE
Glucose, UA: NEGATIVE
Ketones, UA: NEGATIVE
Leukocytes,UA: NEGATIVE
Nitrite, UA: NEGATIVE
Protein,UA: NEGATIVE
RBC, UA: NEGATIVE
Specific Gravity, UA: 1.02 (ref 1.005–1.030)
Urobilinogen, Ur: 0.2 mg/dL (ref 0.2–1.0)
pH, UA: 5.5 (ref 5.0–7.5)

## 2021-11-15 NOTE — Progress Notes (Signed)
[employer]- see scanned document 

## 2021-11-15 NOTE — Addendum Note (Signed)
Addended by: Cleda Daub on: 11/15/2021 09:19 AM   Modules accepted: Orders

## 2021-11-22 ENCOUNTER — Other Ambulatory Visit: Payer: Self-pay | Admitting: Family

## 2021-11-22 DIAGNOSIS — E039 Hypothyroidism, unspecified: Secondary | ICD-10-CM

## 2022-01-12 ENCOUNTER — Ambulatory Visit (INDEPENDENT_AMBULATORY_CARE_PROVIDER_SITE_OTHER): Payer: Managed Care, Other (non HMO) | Admitting: Family

## 2022-01-12 ENCOUNTER — Encounter: Payer: Self-pay | Admitting: Family

## 2022-01-12 VITALS — BP 123/77 | HR 75 | Temp 96.9°F | Ht 72.0 in | Wt 207.0 lb

## 2022-01-12 DIAGNOSIS — K219 Gastro-esophageal reflux disease without esophagitis: Secondary | ICD-10-CM | POA: Diagnosis not present

## 2022-01-12 DIAGNOSIS — I10 Essential (primary) hypertension: Secondary | ICD-10-CM

## 2022-01-12 DIAGNOSIS — Z1211 Encounter for screening for malignant neoplasm of colon: Secondary | ICD-10-CM

## 2022-01-12 DIAGNOSIS — E039 Hypothyroidism, unspecified: Secondary | ICD-10-CM

## 2022-01-12 DIAGNOSIS — F411 Generalized anxiety disorder: Secondary | ICD-10-CM

## 2022-01-12 MED ORDER — ESCITALOPRAM OXALATE 20 MG PO TABS: 20.0000 mg | ORAL_TABLET | Freq: Every day | ORAL | 2 refills | Status: DC

## 2022-01-12 NOTE — Patient Instructions (Signed)

## 2022-01-12 NOTE — Progress Notes (Signed)
? ?Subjective:  ? ? Patient ID: Daniel Ellis, male    DOB: 1971-08-24, 51 y.o.   MRN: [id number] ? ?Chief Complaint  ?Patient presents with  ? Follow-up  ? ?PT presents to the office today for chronic follow up. Pt has hx of pulmonary infiltrate and possibility of sarcoidosis. Has not seen Pulmonologist since 2019.  ? ?He was started on Lexapro on our last visit. States his GAD is greatly improved. ?Anxiety ?Presents for follow-up visit. Symptoms include excessive worry, irritability, nervous/anxious behavior and restlessness. Symptoms occur occasionally. The severity of symptoms is moderate.  ? ? ?Gastroesophageal Reflux ?He complains of belching and heartburn. This is a chronic problem. The current episode started more than 1 year ago. The problem occurs occasionally. The problem has been waxing and waning. Associated symptoms include fatigue. He has tried a PPI for the symptoms. The treatment provided moderate relief.  ?Thyroid Problem ?Presents for follow-up visit. Symptoms include anxiety and fatigue. Patient reports no nail problem. The symptoms have been stable.  ? ? ? ?Review of Systems  ?Constitutional:  Positive for fatigue and irritability.  ?Gastrointestinal:  Positive for heartburn.  ?Psychiatric/Behavioral:  The patient is nervous/anxious.   ?All other systems reviewed and are negative. ? ?   ?Objective:  ? Physical Exam ?Vitals reviewed.  ?Constitutional:   ?   General: He is not in acute distress. ?   Appearance: He is well-developed.  ?HENT:  ?   Head: Normocephalic.  ?   Right Ear: Tympanic membrane normal.  ?   Left Ear: Tympanic membrane normal.  ?Eyes:  ?   General:     ?   Right eye: No discharge.     ?   Left eye: No discharge.  ?   Pupils: Pupils are equal, round, and reactive to light.  ?Neck:  ?   Thyroid: No thyromegaly.  ?Cardiovascular:  ?   Rate and Rhythm: Normal rate and regular rhythm.  ?   Heart sounds: Normal heart sounds. No murmur heard. ?Pulmonary:  ?   Effort: Pulmonary effort  is normal. No respiratory distress.  ?   Breath sounds: Normal breath sounds. No wheezing.  ?Abdominal:  ?   General: Bowel sounds are normal. There is no distension.  ?   Palpations: Abdomen is soft.  ?   Tenderness: There is no abdominal tenderness.  ?Musculoskeletal:     ?   General: No tenderness. Normal range of motion.  ?   Cervical back: Normal range of motion and neck supple.  ?Skin: ?   General: Skin is warm and dry.  ?   Findings: No erythema or rash.  ?Neurological:  ?   Mental Status: He is alert and oriented to person, place, and time.  ?   Cranial Nerves: No cranial nerve deficit.  ?   Deep Tendon Reflexes: Reflexes are normal and symmetric.  ?Psychiatric:     ?   Behavior: Behavior normal.     ?   Thought Content: Thought content normal.     ?   Judgment: Judgment normal.  ? ? ? ? ?BP 140/80   Pulse 75   Temp (!) 96.9 ?F (36.1 ?C) (Temporal)   Ht 6' (1.829 m)   Wt 207 lb (93.9 kg)   BMI 28.07 kg/m?  ? ?   ?Assessment & Plan:  ?[name] comes in today with chief complaint of Follow-up ? ? ?Diagnosis and orders addressed: ? ?1. GAD (generalized anxiety disorder) ?Continue Lexapro  20 mg ?Stress managment ?- escitalopram (LEXAPRO) 20 MG tablet; Take 1 tablet (20 mg total) by mouth daily.  Dispense: 90 tablet; Refill: 2 ? ?2. Gastroesophageal reflux disease, unspecified whether esophagitis present ? ?3. Essential hypertension ? ?4. Hypothyroidism, unspecified type ? ?5. Colon cancer screening ?- Cologuard ? ? ?Labs pending ?Health Maintenance reviewed ?Diet and exercise encouraged ? ?Follow up plan: ?6 months ? ? ?Evelina Dun, FNP ? ? ? ?

## 2022-05-15 ENCOUNTER — Other Ambulatory Visit: Payer: Self-pay | Admitting: Family

## 2022-05-15 DIAGNOSIS — E039 Hypothyroidism, unspecified: Secondary | ICD-10-CM

## 2022-07-16 ENCOUNTER — Ambulatory Visit (INDEPENDENT_AMBULATORY_CARE_PROVIDER_SITE_OTHER): Payer: Managed Care, Other (non HMO) | Admitting: Family

## 2022-07-16 ENCOUNTER — Encounter: Payer: Self-pay | Admitting: Family

## 2022-07-16 VITALS — BP 135/88 | HR 72 | Temp 98.1°F | Ht 72.0 in | Wt 204.0 lb

## 2022-07-16 DIAGNOSIS — I10 Essential (primary) hypertension: Secondary | ICD-10-CM | POA: Diagnosis not present

## 2022-07-16 DIAGNOSIS — K219 Gastro-esophageal reflux disease without esophagitis: Secondary | ICD-10-CM

## 2022-07-16 DIAGNOSIS — Z0000 Encounter for general adult medical examination without abnormal findings: Secondary | ICD-10-CM

## 2022-07-16 DIAGNOSIS — Z0001 Encounter for general adult medical examination with abnormal findings: Secondary | ICD-10-CM

## 2022-07-16 DIAGNOSIS — F411 Generalized anxiety disorder: Secondary | ICD-10-CM | POA: Diagnosis not present

## 2022-07-16 DIAGNOSIS — Z1211 Encounter for screening for malignant neoplasm of colon: Secondary | ICD-10-CM

## 2022-07-16 DIAGNOSIS — E039 Hypothyroidism, unspecified: Secondary | ICD-10-CM

## 2022-07-16 MED ORDER — ESCITALOPRAM OXALATE 20 MG PO TABS: 20.0000 mg | ORAL_TABLET | Freq: Every day | ORAL | 2 refills | Status: DC

## 2022-07-16 NOTE — Patient Instructions (Signed)
Health Maintenance, Male Adopting a healthy lifestyle and getting preventive care are important in promoting health and wellness. Ask your health care provider about: The right schedule for you to have regular tests and exams. Things you can do on your own to prevent diseases and keep yourself healthy. What should I know about diet, weight, and exercise? Eat a healthy diet  Eat a diet that includes plenty of vegetables, fruits, low-fat dairy products, and lean protein. Do not eat a lot of foods that are high in solid fats, added sugars, or sodium. Maintain a healthy weight Body mass index (BMI) is a measurement that can be used to identify possible weight problems. It estimates body fat based on height and weight. Your health care provider can help determine your BMI and help you achieve or maintain a healthy weight. Get regular exercise Get regular exercise. This is one of the most important things you can do for your health. Most adults should: Exercise for at least 150 minutes each week. The exercise should increase your heart rate and make you sweat (moderate-intensity exercise). Do strengthening exercises at least twice a week. This is in addition to the moderate-intensity exercise. Spend less time sitting. Even light physical activity can be beneficial. Watch cholesterol and blood lipids Have your blood tested for lipids and cholesterol at 53 years of age, then have this test every 5 years. You may need to have your cholesterol levels checked more often if: Your lipid or cholesterol levels are high. You are older than 53 years of age. You are at high risk for heart disease. What should I know about cancer screening? Many types of cancers can be detected early and may often be prevented. Depending on your health history and family history, you may need to have cancer screening at various ages. This may include screening for: Colorectal cancer. Prostate cancer. Skin cancer. Lung  cancer. What should I know about heart disease, diabetes, and high blood pressure? Blood pressure and heart disease High blood pressure causes heart disease and increases the risk of stroke. This is more likely to develop in people who have high blood pressure readings or are overweight. Talk with your health care provider about your target blood pressure readings. Have your blood pressure checked: Every 3-5 years if you are 18-39 years of age. Every year if you are 40 years old or older. If you are between the ages of 65 and 75 and are a current or former smoker, ask your health care provider if you should have a one-time screening for abdominal aortic aneurysm (AAA). Diabetes Have regular diabetes screenings. This checks your fasting blood sugar level. Have the screening done: Once every three years after age 45 if you are at a normal weight and have a low risk for diabetes. More often and at a younger age if you are overweight or have a high risk for diabetes. What should I know about preventing infection? Hepatitis B If you have a higher risk for hepatitis B, you should be screened for this virus. Talk with your health care provider to find out if you are at risk for hepatitis B infection. Hepatitis C Blood testing is recommended for: Everyone born from 1945 through 1965. Anyone with known risk factors for hepatitis C. Sexually transmitted infections (STIs) You should be screened each year for STIs, including gonorrhea and chlamydia, if: You are sexually active and are younger than 53 years of age. You are older than 53 years of age and your   health care provider tells you that you are at risk for this type of infection. Your sexual activity has changed since you were last screened, and you are at increased risk for chlamydia or gonorrhea. Ask your health care provider if you are at risk. Ask your health care provider about whether you are at high risk for HIV. Your health care provider  may recommend a prescription medicine to help prevent HIV infection. If you choose to take medicine to prevent HIV, you should first get tested for HIV. You should then be tested every 3 months for as long as you are taking the medicine. Follow these instructions at home: Alcohol use Do not drink alcohol if your health care provider tells you not to drink. If you drink alcohol: Limit how much you have to 0-2 drinks a day. Know how much alcohol is in your drink. In the [place] one drink equals one 12 oz bottle of beer (355 mL), one 5 oz glass of wine (148 mL), or one 1 oz glass of hard liquor (44 mL). Lifestyle Do not use any products that contain nicotine or tobacco. These products include cigarettes, chewing tobacco, and vaping devices, such as e-cigarettes. If you need help quitting, ask your health care provider. Do not use street drugs. Do not share needles. Ask your health care provider for help if you need support or information about quitting drugs. General instructions Schedule regular health, dental, and eye exams. Stay current with your vaccines. Tell your health care provider if: You often feel depressed. You have ever been abused or do not feel safe at home. Summary Adopting a healthy lifestyle and getting preventive care are important in promoting health and wellness. Follow your health care provider's instructions about healthy diet, exercising, and getting tested or screened for diseases. Follow your health care provider's instructions on monitoring your cholesterol and blood pressure. This information is not intended to replace advice given to you by your health care provider. Make sure you discuss any questions you have with your health care provider. Document Revised: 02/27/2021 Document Reviewed: 02/27/2021 Elsevier Patient Education  2023 Elsevier Inc.  

## 2022-07-16 NOTE — Progress Notes (Signed)
Subjective:    Patient ID: Daniel Ellis, male    DOB: Mar 03, 1971, 51 y.o.   MRN: [id number]  Chief Complaint  Patient presents with   Medical Management of Chronic Issues    No Concerns    PT presents to the office today for CPE.  Hypertension This is a chronic problem. The current episode started more than 1 year ago. The problem has been resolved since onset. The problem is controlled. Associated symptoms include anxiety. Pertinent negatives include no malaise/fatigue, peripheral edema or shortness of breath. Risk factors for coronary artery disease include dyslipidemia, obesity and male gender. The current treatment provides moderate improvement. Identifiable causes of hypertension include a thyroid problem.  Gastroesophageal Reflux He complains of belching and heartburn. This is a chronic problem. The current episode started more than 1 year ago. The problem occurs occasionally. Pertinent negatives include no fatigue. Risk factors include obesity. He has tried a PPI for the symptoms. The treatment provided moderate relief.  Thyroid Problem Presents for follow-up visit. Patient reports no anxiety, depressed mood, dry skin, fatigue or nail problem. The symptoms have been stable.  Anxiety Presents for follow-up visit. Patient reports no depressed mood, irritability, nervous/anxious behavior, obsessions or shortness of breath. Symptoms occur occasionally. The severity of symptoms is mild.        Review of Systems  Constitutional:  Negative for fatigue, irritability and malaise/fatigue.  Respiratory:  Negative for shortness of breath.   Gastrointestinal:  Positive for heartburn.  Psychiatric/Behavioral:  The patient is not nervous/anxious.   All other systems reviewed and are negative.  Social History   Socioeconomic History   Marital status: Married    Spouse name: Not on file   Number of children: Not on file   Years of education: Not on file   Highest education level: Not  on file  Occupational History   Not on file  Tobacco Use   Smoking status: Former    Packs/day: 0.75    Years: 10.00    Total pack years: 7.50    Types: Cigarettes    Quit date: 10/19/1996    Years since quitting: 25.7   Smokeless tobacco: Current    Types: Snuff  Substance and Sexual Activity   Alcohol use: Yes    Comment: rarely   Drug use: No   Sexual activity: Not on file  Other Topics Concern   Not on file  Social History Narrative   Not on file   Social Determinants of Health   Financial Resource Strain: Not on file  Food Insecurity: Not on file  Transportation Needs: Not on file  Physical Activity: Not on file  Stress: Not on file  Social Connections: Not on file   Family History  Problem Relation Age of Onset   Diabetes Father         Objective:   Physical Exam Vitals reviewed.  Constitutional:      General: He is not in acute distress.    Appearance: He is well-developed.  HENT:     Head: Normocephalic.     Right Ear: Tympanic membrane normal.     Left Ear: Tympanic membrane normal.  Eyes:     General:        Right eye: No discharge.        Left eye: No discharge.     Pupils: Pupils are equal, round, and reactive to light.  Neck:     Thyroid: No thyromegaly.  Cardiovascular:     Rate  and Rhythm: Normal rate and regular rhythm.     Heart sounds: Normal heart sounds. No murmur heard. Pulmonary:     Effort: Pulmonary effort is normal. No respiratory distress.     Breath sounds: Normal breath sounds. No wheezing.  Abdominal:     General: Bowel sounds are normal. There is no distension.     Palpations: Abdomen is soft.     Tenderness: There is no abdominal tenderness.  Musculoskeletal:        General: No tenderness. Normal range of motion.     Cervical back: Normal range of motion and neck supple.  Skin:    General: Skin is warm and dry.     Findings: No erythema or rash.  Neurological:     Mental Status: He is alert and oriented to person,  place, and time.     Cranial Nerves: No cranial nerve deficit.     Deep Tendon Reflexes: Reflexes are normal and symmetric.  Psychiatric:        Behavior: Behavior normal.        Thought Content: Thought content normal.        Judgment: Judgment normal.        BP 135/88   Pulse 72   Temp 98.1 F (36.7 C) (Temporal)   Ht 6' (1.829 m)   Wt 204 lb (92.5 kg)   BMI 27.67 kg/m   Assessment & Plan:  Daniel Ellis comes in today with chief complaint of Medical Management of Chronic Issues (No Concerns )   Diagnosis and orders addressed:  1. GAD (generalized anxiety disorder)  - escitalopram (LEXAPRO) 20 MG tablet; Take 1 tablet (20 mg total) by mouth daily.  Dispense: 90 tablet; Refill: 2 - CMP14+EGFR - CBC with Differential/Platelet  2. Essential hypertension - CMP14+EGFR - CBC with Differential/Platelet  3. Gastroesophageal reflux disease, unspecified whether esophagitis present - CMP14+EGFR - CBC with Differential/Platelet  4. Hypothyroidism, unspecified type - CMP14+EGFR - CBC with Differential/Platelet - TSH  5. Annual physical exam - escitalopram (LEXAPRO) 20 MG tablet; Take 1 tablet (20 mg total) by mouth daily.  Dispense: 90 tablet; Refill: 2 - CMP14+EGFR - CBC with Differential/Platelet - Lipid panel - TSH  6. Colon cancer screening - Cologuard   Labs pending Health Maintenance reviewed Diet and exercise encouraged  Follow up plan: 6 months   Evelina Dun, FNP

## 2022-07-17 ENCOUNTER — Other Ambulatory Visit: Payer: Self-pay | Admitting: Family Medicine

## 2022-07-17 DIAGNOSIS — R7989 Other specified abnormal findings of blood chemistry: Secondary | ICD-10-CM

## 2022-07-17 LAB — CBC WITH DIFFERENTIAL/PLATELET
Basophils Absolute: 0 10*3/uL (ref 0.0–0.2)
Basos: 1 %
EOS (ABSOLUTE): 0.1 10*3/uL (ref 0.0–0.4)
Eos: 3 %
Hematocrit: 41.4 % (ref 37.5–51.0)
Hemoglobin: 14.3 g/dL (ref 13.0–17.7)
Immature Grans (Abs): 0 10*3/uL (ref 0.0–0.1)
Immature Granulocytes: 0 %
Lymphocytes Absolute: 0.8 10*3/uL (ref 0.7–3.1)
Lymphs: 19 %
MCH: 32.5 pg (ref 26.6–33.0)
MCHC: 34.5 g/dL (ref 31.5–35.7)
MCV: 94 fL (ref 79–97)
Monocytes Absolute: 0.5 10*3/uL (ref 0.1–0.9)
Monocytes: 12 %
Neutrophils Absolute: 2.8 10*3/uL (ref 1.4–7.0)
Neutrophils: 65 %
Platelets: 245 10*3/uL (ref 150–450)
RBC: 4.4 x10E6/uL (ref 4.14–5.80)
RDW: 12.1 % (ref 11.6–15.4)
WBC: 4.3 10*3/uL (ref 3.4–10.8)

## 2022-07-17 LAB — LIPID PANEL
Chol/HDL Ratio: 1.3 ratio (ref 0.0–5.0)
Cholesterol, Total: 145 mg/dL (ref 100–199)
HDL: 111 mg/dL (ref >39)
LDL Chol Calc (NIH): 25 mg/dL (ref 0–99)
Triglycerides: 36 mg/dL (ref 0–149)
VLDL Cholesterol Cal: 9 mg/dL (ref 5–40)

## 2022-07-17 LAB — CMP14+EGFR
ALT: 65 IU/L — ABNORMAL HIGH (ref 0–44)
AST: 52 IU/L — ABNORMAL HIGH (ref 0–40)
Albumin/Globulin Ratio: 1.7 (ref 1.2–2.2)
Albumin: 4.5 g/dL (ref 3.8–4.9)
Alkaline Phosphatase: 81 IU/L (ref 44–121)
BUN/Creatinine Ratio: 13 (ref 9–20)
BUN: 13 mg/dL (ref 6–24)
Bilirubin Total: 0.3 mg/dL (ref 0.0–1.2)
CO2: 21 mmol/L (ref 20–29)
Calcium: 9.7 mg/dL (ref 8.7–10.2)
Chloride: 103 mmol/L (ref 96–106)
Creatinine, Ser: 1.02 mg/dL (ref 0.76–1.27)
Globulin, Total: 2.7 g/dL (ref 1.5–4.5)
Glucose: 92 mg/dL (ref 70–99)
Potassium: 4.9 mmol/L (ref 3.5–5.2)
Sodium: 141 mmol/L (ref 134–144)
Total Protein: 7.2 g/dL (ref 6.0–8.5)
eGFR: 88 mL/min/{1.73_m2} (ref >59)

## 2022-07-17 LAB — TSH: TSH: 2.14 u[IU]/mL (ref 0.450–4.500)

## 2022-07-24 ENCOUNTER — Telehealth: Payer: Self-pay | Admitting: Family

## 2022-07-24 DIAGNOSIS — E039 Hypothyroidism, unspecified: Secondary | ICD-10-CM

## 2022-07-24 MED ORDER — LEVOTHYROXINE SODIUM 150 MCG PO TABS: 150.0000 ug | ORAL_TABLET | Freq: Every day | ORAL | 1 refills | Status: DC

## 2022-07-24 NOTE — Telephone Encounter (Signed)
  Prescription Request  07/24/2022  Is this a "Controlled Substance" medicine? no  Have you seen your PCP in the last 2 weeks? 9/25  If YES, route message to pool  -  If NO, patient needs to be scheduled for appointment.  What is the name of the medication or equipment? levothyroxine (SYNTHROID) 150 MCG tablet  Have you contacted your pharmacy to request a refill? yes   Which pharmacy would you like this sent to? [facility] in [place]    Patient notified that their request is being sent to the clinical staff for review and that they should receive a response within 2 business days.

## 2022-07-24 NOTE — Telephone Encounter (Signed)
Refill sent.

## 2022-08-06 IMAGING — DX DG CHEST 2V
2 series · 2 of 2 positions shown · non-contrast
Comparison: Chest radiograph 05/19/2018

CLINICAL DATA: Sarcoid

EXAM:
CHEST - 2 VIEW

[chest pa]
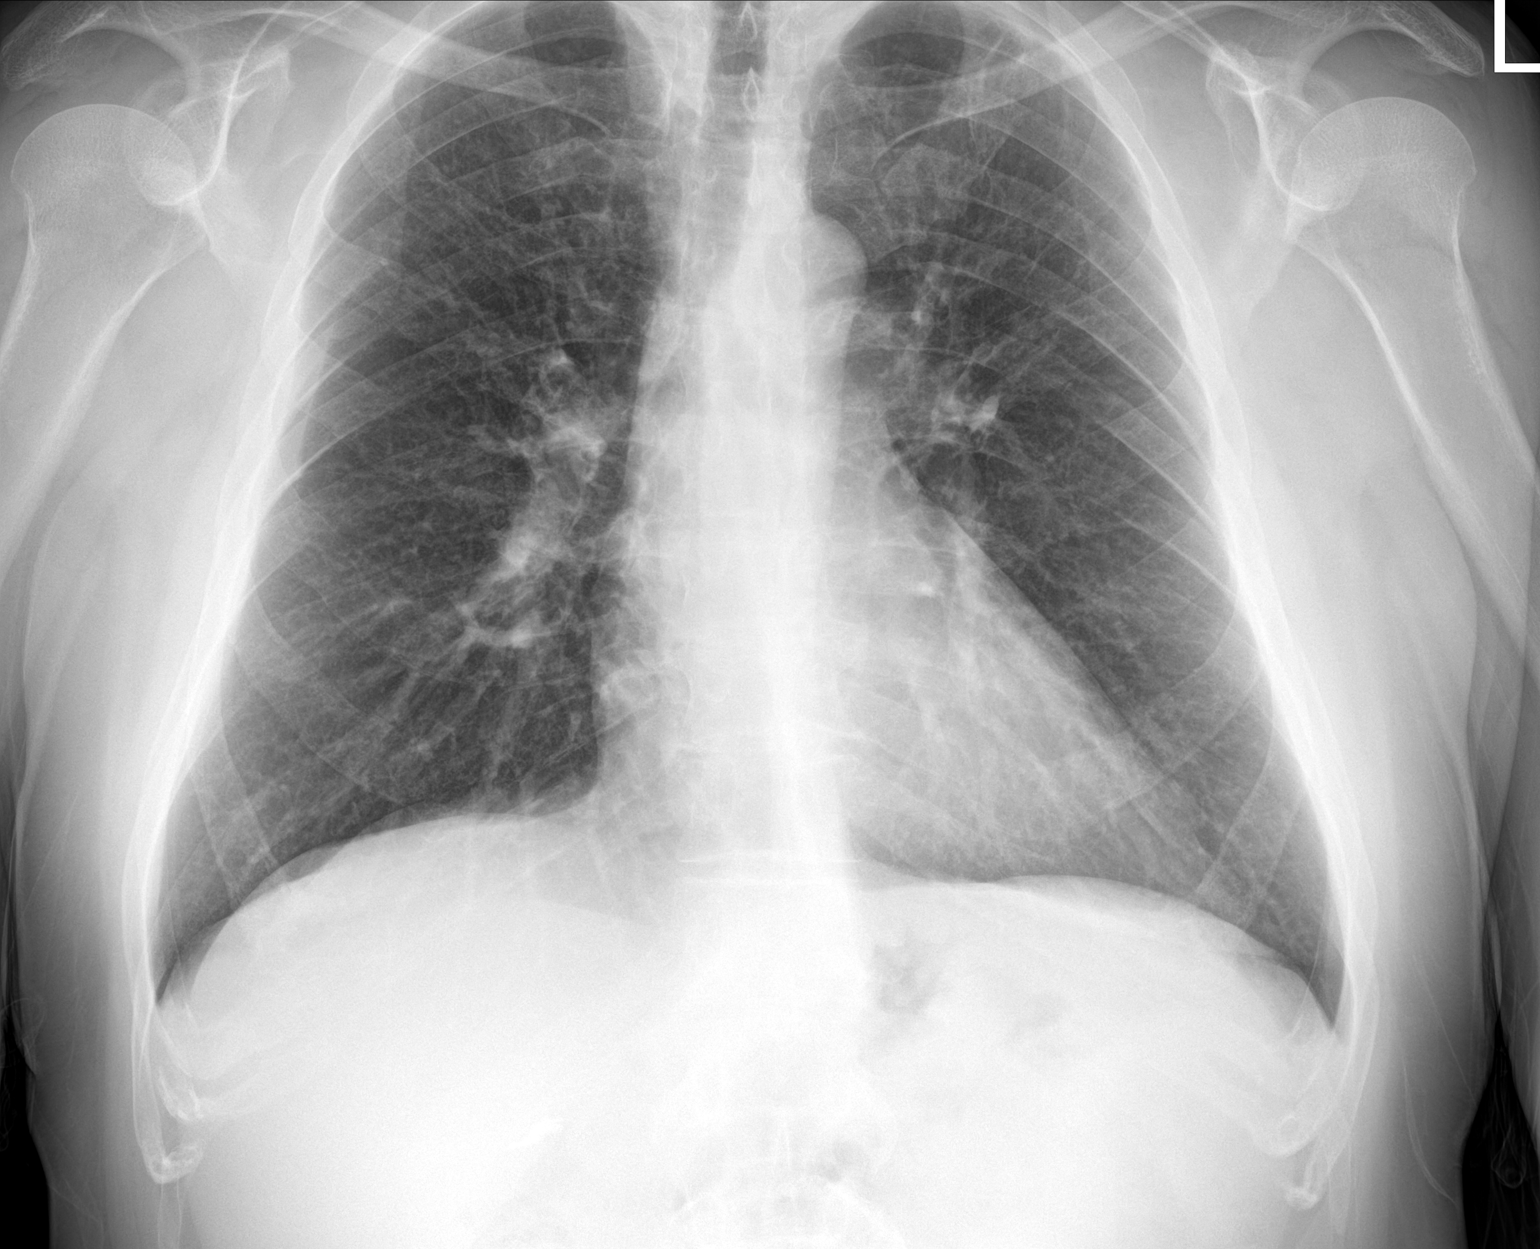

[chest lat]
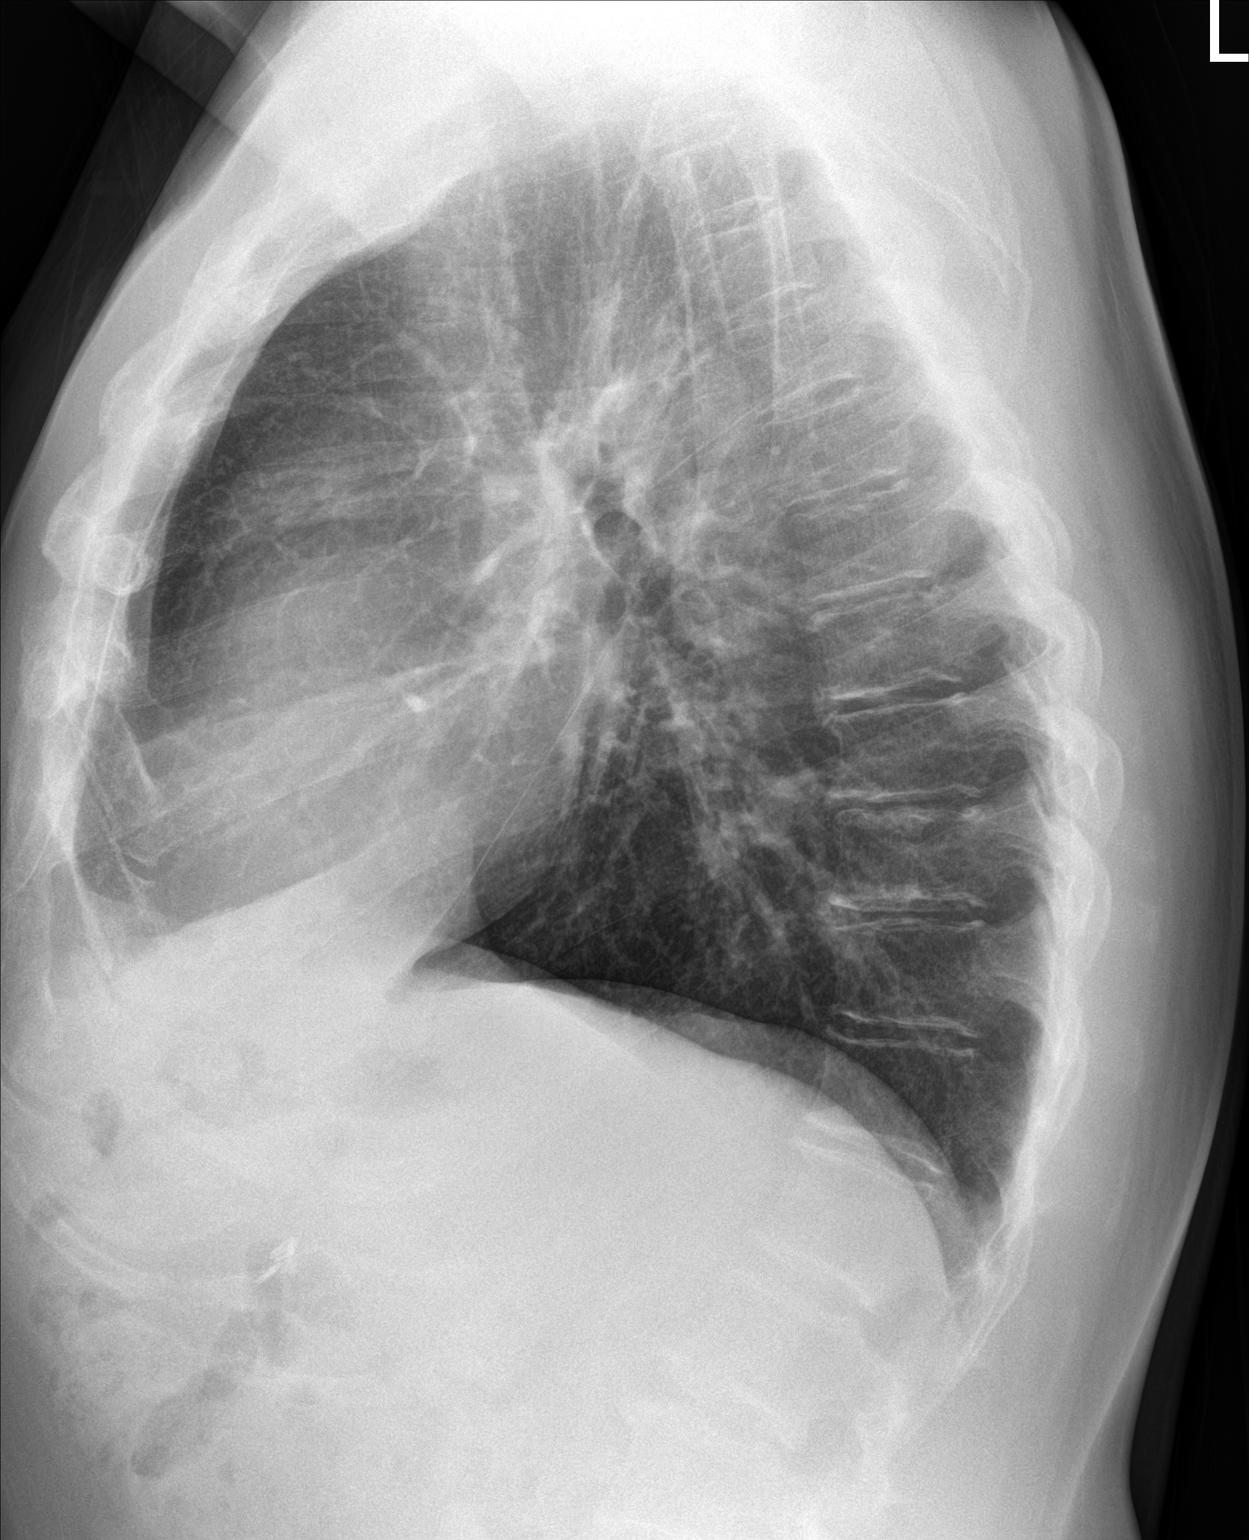

[2 of 2 positions shown; findings below may reference images not displayed]

FINDINGS: The cardiomediastinal silhouette is normal.  The hila appear normal.

There is no focal consolidation or pulmonary edema. There is no
pleural effusion or pneumothorax. Previously seen opacities in the
upper lobes on the radiograph from 05/19/2018 have essentially
resolved.

There is no acute osseous abnormality. Cholecystectomy clips are
noted.
IMPRESSION: Opacities in the upper lobe seen on the chest radiograph from
05/19/2018 have resolved. No focal consolidation or pleural effusion
on the current study. The hila appear normal.

## 2023-01-14 ENCOUNTER — Other Ambulatory Visit: Payer: Self-pay | Admitting: Family

## 2023-01-14 DIAGNOSIS — E039 Hypothyroidism, unspecified: Secondary | ICD-10-CM

## 2023-04-08 ENCOUNTER — Other Ambulatory Visit: Payer: Self-pay | Admitting: Family

## 2023-04-08 DIAGNOSIS — Z0000 Encounter for general adult medical examination without abnormal findings: Secondary | ICD-10-CM

## 2023-04-08 DIAGNOSIS — F411 Generalized anxiety disorder: Secondary | ICD-10-CM

## 2023-05-07 ENCOUNTER — Other Ambulatory Visit: Payer: Self-pay | Admitting: Family

## 2023-05-07 DIAGNOSIS — F411 Generalized anxiety disorder: Secondary | ICD-10-CM

## 2023-05-07 DIAGNOSIS — Z0000 Encounter for general adult medical examination without abnormal findings: Secondary | ICD-10-CM

## 2023-05-07 MED ORDER — ESCITALOPRAM OXALATE 20 MG PO TABS: 20.0000 mg | ORAL_TABLET | Freq: Every day | ORAL | 0 refills | Status: DC

## 2023-05-07 NOTE — Telephone Encounter (Signed)
[facility] pt NTBS 30-d given 04/08/23

## 2023-05-07 NOTE — Telephone Encounter (Signed)
Apt scheduled 05/27/2023

## 2023-05-07 NOTE — Addendum Note (Signed)
Addended by: Julious Payer D on: 05/07/2023 02:34 PM   Modules accepted: Orders

## 2023-05-27 ENCOUNTER — Ambulatory Visit (INDEPENDENT_AMBULATORY_CARE_PROVIDER_SITE_OTHER): Payer: Managed Care, Other (non HMO) | Admitting: Family

## 2023-05-27 ENCOUNTER — Encounter: Payer: Self-pay | Admitting: Family

## 2023-05-27 VITALS — BP 138/83 | HR 78 | Temp 97.6°F | Ht 72.0 in | Wt 192.6 lb

## 2023-05-27 DIAGNOSIS — Z0001 Encounter for general adult medical examination with abnormal findings: Secondary | ICD-10-CM

## 2023-05-27 DIAGNOSIS — I10 Essential (primary) hypertension: Secondary | ICD-10-CM | POA: Diagnosis not present

## 2023-05-27 DIAGNOSIS — Z0000 Encounter for general adult medical examination without abnormal findings: Secondary | ICD-10-CM

## 2023-05-27 DIAGNOSIS — K219 Gastro-esophageal reflux disease without esophagitis: Secondary | ICD-10-CM

## 2023-05-27 DIAGNOSIS — E039 Hypothyroidism, unspecified: Secondary | ICD-10-CM

## 2023-05-27 DIAGNOSIS — F411 Generalized anxiety disorder: Secondary | ICD-10-CM

## 2023-05-27 MED ORDER — ESCITALOPRAM OXALATE 20 MG PO TABS: 20.0000 mg | ORAL_TABLET | Freq: Every day | ORAL | 0 refills | Status: DC

## 2023-05-27 MED ORDER — LEVOTHYROXINE SODIUM 150 MCG PO TABS
150.0000 ug | ORAL_TABLET | Freq: Every day | ORAL | 4 refills | Status: DC
Start: 2023-05-27 — End: 2024-07-15

## 2023-05-27 MED ORDER — ESCITALOPRAM OXALATE 20 MG PO TABS: 20.0000 mg | ORAL_TABLET | Freq: Every day | ORAL | 4 refills | Status: DC

## 2023-05-27 MED ORDER — LEVOTHYROXINE SODIUM 150 MCG PO TABS: 150.0000 ug | ORAL_TABLET | Freq: Every day | ORAL | 1 refills | Status: DC

## 2023-05-27 NOTE — Patient Instructions (Signed)
Health Maintenance, Male Adopting a healthy lifestyle and getting preventive care are important in promoting health and wellness. Ask your health care provider about: The right schedule for you to have regular tests and exams. Things you can do on your own to prevent diseases and keep yourself healthy. What should I know about diet, weight, and exercise? Eat a healthy diet  Eat a diet that includes plenty of vegetables, fruits, low-fat dairy products, and lean protein. Do not eat a lot of foods that are high in solid fats, added sugars, or sodium. Maintain a healthy weight Body mass index (BMI) is a measurement that can be used to identify possible weight problems. It estimates body fat based on height and weight. Your health care provider can help determine your BMI and help you achieve or maintain a healthy weight. Get regular exercise Get regular exercise. This is one of the most important things you can do for your health. Most adults should: Exercise for at least 150 minutes each week. The exercise should increase your heart rate and make you sweat (moderate-intensity exercise). Do strengthening exercises at least twice a week. This is in addition to the moderate-intensity exercise. Spend less time sitting. Even light physical activity can be beneficial. Watch cholesterol and blood lipids Have your blood tested for lipids and cholesterol at 54 years of age, then have this test every 5 years. You may need to have your cholesterol levels checked more often if: Your lipid or cholesterol levels are high. You are older than 54 years of age. You are at high risk for heart disease. What should I know about cancer screening? Many types of cancers can be detected early and may often be prevented. Depending on your health history and family history, you may need to have cancer screening at various ages. This may include screening for: Colorectal cancer. Prostate cancer. Skin cancer. Lung  cancer. What should I know about heart disease, diabetes, and high blood pressure? Blood pressure and heart disease High blood pressure causes heart disease and increases the risk of stroke. This is more likely to develop in people who have high blood pressure readings or are overweight. Talk with your health care provider about your target blood pressure readings. Have your blood pressure checked: Every 3-5 years if you are 18-39 years of age. Every year if you are 40 years old or older. If you are between the ages of 65 and 75 and are a current or former smoker, ask your health care provider if you should have a one-time screening for abdominal aortic aneurysm (AAA). Diabetes Have regular diabetes screenings. This checks your fasting blood sugar level. Have the screening done: Once every three years after age 45 if you are at a normal weight and have a low risk for diabetes. More often and at a younger age if you are overweight or have a high risk for diabetes. What should I know about preventing infection? Hepatitis B If you have a higher risk for hepatitis B, you should be screened for this virus. Talk with your health care provider to find out if you are at risk for hepatitis B infection. Hepatitis C Blood testing is recommended for: Everyone born from 1945 through 1965. Anyone with known risk factors for hepatitis C. Sexually transmitted infections (STIs) You should be screened each year for STIs, including gonorrhea and chlamydia, if: You are sexually active and are younger than 54 years of age. You are older than 54 years of age and your   health care provider tells you that you are at risk for this type of infection. Your sexual activity has changed since you were last screened, and you are at increased risk for chlamydia or gonorrhea. Ask your health care provider if you are at risk. Ask your health care provider about whether you are at high risk for HIV. Your health care provider  may recommend a prescription medicine to help prevent HIV infection. If you choose to take medicine to prevent HIV, you should first get tested for HIV. You should then be tested every 3 months for as long as you are taking the medicine. Follow these instructions at home: Alcohol use Do not drink alcohol if your health care provider tells you not to drink. If you drink alcohol: Limit how much you have to 0-2 drinks a day. Know how much alcohol is in your drink. In the [place] one drink equals one 12 oz bottle of beer (355 mL), one 5 oz glass of wine (148 mL), or one 1 oz glass of hard liquor (44 mL). Lifestyle Do not use any products that contain nicotine or tobacco. These products include cigarettes, chewing tobacco, and vaping devices, such as e-cigarettes. If you need help quitting, ask your health care provider. Do not use street drugs. Do not share needles. Ask your health care provider for help if you need support or information about quitting drugs. General instructions Schedule regular health, dental, and eye exams. Stay current with your vaccines. Tell your health care provider if: You often feel depressed. You have ever been abused or do not feel safe at home. Summary Adopting a healthy lifestyle and getting preventive care are important in promoting health and wellness. Follow your health care provider's instructions about healthy diet, exercising, and getting tested or screened for diseases. Follow your health care provider's instructions on monitoring your cholesterol and blood pressure. This information is not intended to replace advice given to you by your health care provider. Make sure you discuss any questions you have with your health care provider. Document Revised: 02/27/2021 Document Reviewed: 02/27/2021 Elsevier Patient Education  2024 Elsevier Inc.  

## 2023-05-27 NOTE — Addendum Note (Signed)
Addended by: Jannifer Rodney A on: 05/27/2023 12:19 PM   Modules accepted: Orders

## 2023-05-27 NOTE — Progress Notes (Signed)
Subjective:    Patient ID: Daniel Ellis, male    DOB: 1971-04-27, 52 y.o.   MRN: [id number]  Chief Complaint  Patient presents with   Medical Management of Chronic Issues   Pt presents to the office today for CPE. He is divorced and rasing his autistic son.  Hypertension This is a chronic problem. The current episode started more than 1 year ago. The problem has been resolved since onset. The problem is controlled. Associated symptoms include anxiety. Pertinent negatives include no malaise/fatigue, peripheral edema or shortness of breath. Risk factors for coronary artery disease include dyslipidemia, obesity and sedentary lifestyle. The current treatment provides moderate improvement. Identifiable causes of hypertension include a thyroid problem.  Gastroesophageal Reflux He complains of belching and heartburn. He reports no globus sensation. This is a chronic problem. The current episode started more than 1 year ago. The problem occurs occasionally. Pertinent negatives include no fatigue. He has tried a PPI for the symptoms. The treatment provided moderate relief.  Thyroid Problem Presents for follow-up visit. Symptoms include depressed mood. Patient reports no anxiety, constipation, dry skin or fatigue. The symptoms have been stable.  Anxiety Presents for follow-up visit. Symptoms include depressed mood and excessive worry. Patient reports no nervous/anxious behavior or shortness of breath. Symptoms occur occasionally.        Review of Systems  Constitutional:  Negative for fatigue and malaise/fatigue.  Respiratory:  Negative for shortness of breath.   Gastrointestinal:  Positive for heartburn. Negative for constipation.  Psychiatric/Behavioral:  The patient is not nervous/anxious.   All other systems reviewed and are negative.  Family History  Problem Relation Age of Onset   Diabetes Father    Social History   Socioeconomic History   Marital status: Married    Spouse name:  Not on file   Number of children: Not on file   Years of education: Not on file   Highest education level: Not on file  Occupational History   Not on file  Tobacco Use   Smoking status: Former    Current packs/day: 0.00    Average packs/day: 0.8 packs/day for 10.0 years (7.5 ttl pk-yrs)    Types: Cigarettes    Start date: 10/19/1986    Quit date: 10/19/1996    Years since quitting: 26.6   Smokeless tobacco: Current    Types: Snuff  Substance and Sexual Activity   Alcohol use: Yes    Comment: rarely   Drug use: No   Sexual activity: Not on file  Other Topics Concern   Not on file  Social History Narrative   Not on file   Social Determinants of Health   Financial Resource Strain: Not on file  Food Insecurity: Not on file  Transportation Needs: Not on file  Physical Activity: Not on file  Stress: Not on file  Social Connections: Not on file       Objective:   Physical Exam Vitals reviewed.  Constitutional:      General: He is not in acute distress.    Appearance: He is well-developed.  HENT:     Head: Normocephalic.     Right Ear: Tympanic membrane normal.     Left Ear: Tympanic membrane normal.  Eyes:     General:        Right eye: No discharge.        Left eye: No discharge.     Pupils: Pupils are equal, round, and reactive to light.  Neck:  Thyroid: No thyromegaly.  Cardiovascular:     Rate and Rhythm: Normal rate and regular rhythm.     Heart sounds: Normal heart sounds. No murmur heard. Pulmonary:     Effort: Pulmonary effort is normal. No respiratory distress.     Breath sounds: Normal breath sounds. No wheezing.  Abdominal:     General: Bowel sounds are normal. There is no distension.     Palpations: Abdomen is soft.     Tenderness: There is no abdominal tenderness.  Musculoskeletal:        General: No tenderness. Normal range of motion.     Cervical back: Normal range of motion and neck supple.  Skin:    General: Skin is warm and dry.      Findings: No erythema or rash.  Neurological:     Mental Status: He is alert and oriented to person, place, and time.     Cranial Nerves: No cranial nerve deficit.     Deep Tendon Reflexes: Reflexes are normal and symmetric.  Psychiatric:        Behavior: Behavior normal.        Thought Content: Thought content normal.        Judgment: Judgment normal.       BP 138/83   Pulse 78   Temp 97.6 F (36.4 C) (Temporal)   Ht 6' (1.829 m)   Wt 192 lb 9.6 oz (87.4 kg)   SpO2 99%   BMI 26.12 kg/m      Assessment & Plan:  Daniel Ellis comes in today with chief complaint of Medical Management of Chronic Issues   Diagnosis and orders addressed:  1. GAD (generalized anxiety disorder) - escitalopram (LEXAPRO) 20 MG tablet; Take 1 tablet (20 mg total) by mouth daily.  Dispense: 30 tablet; Refill: 0 - CMP14+EGFR - CBC with Differential/Platelet  2. Annual physical exam - CMP14+EGFR - CBC with Differential/Platelet - Lipid panel - PSA, total and free - TSH  3. Hypothyroidism, unspecified type - levothyroxine (SYNTHROID) 150 MCG tablet; Take 1 tablet (150 mcg total) by mouth daily before breakfast.  Dispense: 90 tablet; Refill: 1 - CMP14+EGFR - CBC with Differential/Platelet  4. Essential hypertension - CMP14+EGFR - CBC with Differential/Platelet  5. Gastroesophageal reflux disease, unspecified whether esophagitis present - CMP14+EGFR - CBC with Differential/Platelet   Labs pending Health Maintenance reviewed Diet and exercise encouraged  Follow up plan: 6 months    Jannifer Rodney, FNP

## 2024-05-28 ENCOUNTER — Encounter: Payer: Managed Care, Other (non HMO) | Admitting: Family

## 2024-06-16 ENCOUNTER — Other Ambulatory Visit: Payer: Self-pay | Admitting: Family

## 2024-06-16 DIAGNOSIS — F411 Generalized anxiety disorder: Secondary | ICD-10-CM

## 2024-06-16 NOTE — Telephone Encounter (Signed)
 Christy NTBS last OV 05/27/23 NO RF sent to pharmacy last OV greater than a year

## 2024-06-16 NOTE — Telephone Encounter (Signed)
Left message to schedule appt

## 2024-07-10 ENCOUNTER — Other Ambulatory Visit: Payer: Self-pay | Admitting: Family

## 2024-07-10 DIAGNOSIS — E039 Hypothyroidism, unspecified: Secondary | ICD-10-CM

## 2024-07-10 NOTE — Telephone Encounter (Signed)
 Christy NTBS last OV 05/27/23 NO RF sent to pharmacy last OV greater than a year

## 2024-07-10 NOTE — Telephone Encounter (Signed)
 Pt has scheduled apt for 07/16/2024.

## 2024-07-16 ENCOUNTER — Encounter: Payer: Self-pay | Admitting: Family

## 2024-07-16 ENCOUNTER — Ambulatory Visit (INDEPENDENT_AMBULATORY_CARE_PROVIDER_SITE_OTHER): Admitting: Family

## 2024-07-16 VITALS — BP 135/80 | HR 59 | Temp 97.8°F | Ht 72.0 in | Wt 201.2 lb

## 2024-07-16 DIAGNOSIS — Z0001 Encounter for general adult medical examination with abnormal findings: Secondary | ICD-10-CM

## 2024-07-16 DIAGNOSIS — Z1159 Encounter for screening for other viral diseases: Secondary | ICD-10-CM

## 2024-07-16 DIAGNOSIS — K219 Gastro-esophageal reflux disease without esophagitis: Secondary | ICD-10-CM

## 2024-07-16 DIAGNOSIS — E039 Hypothyroidism, unspecified: Secondary | ICD-10-CM | POA: Diagnosis not present

## 2024-07-16 DIAGNOSIS — F411 Generalized anxiety disorder: Secondary | ICD-10-CM

## 2024-07-16 DIAGNOSIS — Z0000 Encounter for general adult medical examination without abnormal findings: Secondary | ICD-10-CM

## 2024-07-16 DIAGNOSIS — I10 Essential (primary) hypertension: Secondary | ICD-10-CM

## 2024-07-16 LAB — LIPID PANEL

## 2024-07-16 MED ORDER — LEVOTHYROXINE SODIUM 150 MCG PO TABS: 150.0000 ug | ORAL_TABLET | Freq: Every day | ORAL | 4 refills | Status: AC

## 2024-07-16 MED ORDER — ESCITALOPRAM OXALATE 20 MG PO TABS: 20.0000 mg | ORAL_TABLET | Freq: Every day | ORAL | 4 refills | Status: AC

## 2024-07-16 MED ORDER — ESOMEPRAZOLE MAGNESIUM 40 MG PO CPDR: 40.0000 mg | DELAYED_RELEASE_CAPSULE | Freq: Every day | ORAL | 3 refills | Status: AC

## 2024-07-16 NOTE — Progress Notes (Signed)
 Subjective:    Patient ID: Daniel Ellis, male    DOB: 03/23/1971, 53 y.o.   MRN: [id number]  Chief Complaint  Patient presents with   Medical Management of Chronic Issues   Pt presents to the office today for CPE.  Hypertension This is a chronic problem. The current episode started more than 1 year ago. The problem has been resolved since onset. The problem is controlled. Associated symptoms include anxiety. Pertinent negatives include no malaise/fatigue, peripheral edema or shortness of breath. Risk factors for coronary artery disease include dyslipidemia, sedentary lifestyle and male gender. The current treatment provides moderate improvement. Identifiable causes of hypertension include a thyroid  problem.  Gastroesophageal Reflux He complains of belching and heartburn. He reports no globus sensation. This is a chronic problem. The current episode started more than 1 year ago. The problem occurs occasionally. The symptoms are aggravated by certain foods. Pertinent negatives include no fatigue. He has tried a PPI for the symptoms. The treatment provided moderate relief.  Thyroid  Problem Presents for follow-up visit. Symptoms include depressed mood. Patient reports no anxiety, constipation, dry skin or fatigue. The symptoms have been stable.  Anxiety Presents for follow-up visit. Symptoms include depressed mood and excessive worry. Patient reports no nervous/anxious behavior or shortness of breath. Symptoms occur occasionally. The severity of symptoms is mild.        Review of Systems  Constitutional:  Negative for fatigue and malaise/fatigue.  Respiratory:  Negative for shortness of breath.   Gastrointestinal:  Positive for heartburn. Negative for constipation.  Psychiatric/Behavioral:  The patient is not nervous/anxious.   All other systems reviewed and are negative.  Family History  Problem Relation Age of Onset   Diabetes Father    Social History   Socioeconomic History    Marital status: Married    Spouse name: Not on file   Number of children: Not on file   Years of education: Not on file   Highest education level: Not on file  Occupational History   Not on file  Tobacco Use   Smoking status: Former    Current packs/day: 0.00    Average packs/day: 0.8 packs/day for 10.0 years (7.5 ttl pk-yrs)    Types: Cigarettes    Start date: 10/19/1986    Quit date: 10/19/1996    Years since quitting: 27.7   Smokeless tobacco: Current    Types: Snuff  Substance and Sexual Activity   Alcohol use: Yes    Comment: rarely   Drug use: No   Sexual activity: Not on file  Other Topics Concern   Not on file  Social History Narrative   Not on file   Social Drivers of Health   Financial Resource Strain: Not on file  Food Insecurity: Not on file  Transportation Needs: Not on file  Physical Activity: Not on file  Stress: Not on file  Social Connections: Not on file       Objective:   Physical Exam Vitals reviewed.  Constitutional:      General: He is not in acute distress.    Appearance: He is well-developed.  HENT:     Head: Normocephalic.     Right Ear: Tympanic membrane normal.     Left Ear: Tympanic membrane normal.  Eyes:     General:        Right eye: No discharge.        Left eye: No discharge.     Pupils: Pupils are equal, round, and reactive to light.  Neck:     Thyroid : No thyromegaly.  Cardiovascular:     Rate and Rhythm: Normal rate and regular rhythm.     Heart sounds: Normal heart sounds. No murmur heard. Pulmonary:     Effort: Pulmonary effort is normal. No respiratory distress.     Breath sounds: Normal breath sounds. No wheezing.  Abdominal:     General: Bowel sounds are normal. There is no distension.     Palpations: Abdomen is soft.     Tenderness: There is no abdominal tenderness.  Musculoskeletal:        General: No tenderness. Normal range of motion.     Cervical back: Normal range of motion and neck supple.  Skin:     General: Skin is warm and dry.     Findings: No erythema or rash.  Neurological:     Mental Status: He is alert and oriented to person, place, and time.     Cranial Nerves: No cranial nerve deficit.     Deep Tendon Reflexes: Reflexes are normal and symmetric.  Psychiatric:        Behavior: Behavior normal.        Thought Content: Thought content normal.        Judgment: Judgment normal.       BP 135/80   Pulse (!) 59   Temp 97.8 F (36.6 C) (Temporal)   Ht 6' (1.829 m)   Wt 201 lb 3.2 oz (91.3 kg)   BMI 27.29 kg/m      Assessment & Plan:  Daniel Ellis comes in today with chief complaint of Medical Management of Chronic Issues   Diagnosis and orders addressed:  1. GAD (generalized anxiety disorder) - escitalopram  (LEXAPRO ) 20 MG tablet; Take 1 tablet (20 mg total) by mouth daily.  Dispense: 90 tablet; Refill: 4 - CBC with Differential/Platelet - CMP14+EGFR  2. Hypothyroidism, unspecified type - levothyroxine  (SYNTHROID ) 150 MCG tablet; Take 1 tablet (150 mcg total) by mouth daily before breakfast.  Dispense: 90 tablet; Refill: 4 - CBC with Differential/Platelet - CMP14+EGFR - TSH  3. Annual physical exam (Primary) - CBC with Differential/Platelet - CMP14+EGFR - Lipid panel - TSH - PSA, total and free  4. Essential hypertension  - CBC with Differential/Platelet - CMP14+EGFR  5. Gastroesophageal reflux disease, unspecified whether esophagitis present - CBC with Differential/Platelet - CMP14+EGFR - esomeprazole  (NEXIUM ) 40 MG capsule; Take 1 capsule (40 mg total) by mouth daily at 12 noon.  Dispense: 90 capsule; Refill: 3  6. Need for hepatitis B screening test - Hepatitis B surface antibody,quantitative   Labs pending Continue current medications  Health Maintenance reviewed Diet and exercise encouraged  Follow up plan: 6 months    Bari Learn, FNP

## 2024-07-16 NOTE — Patient Instructions (Signed)
 Health Maintenance, Male  Adopting a healthy lifestyle and getting preventive care are important in promoting health and wellness. Ask your health care provider about:  The right schedule for you to have regular tests and exams.  Things you can do on your own to prevent diseases and keep yourself healthy.  What should I know about diet, weight, and exercise?  Eat a healthy diet    Eat a diet that includes plenty of vegetables, fruits, low-fat dairy products, and lean protein.  Do not eat a lot of foods that are high in solid fats, added sugars, or sodium.  Maintain a healthy weight  Body mass index (BMI) is a measurement that can be used to identify possible weight problems. It estimates body fat based on height and weight. Your health care provider can help determine your BMI and help you achieve or maintain a healthy weight.  Get regular exercise  Get regular exercise. This is one of the most important things you can do for your health. Most adults should:  Exercise for at least 150 minutes each week. The exercise should increase your heart rate and make you sweat (moderate-intensity exercise).  Do strengthening exercises at least twice a week. This is in addition to the moderate-intensity exercise.  Spend less time sitting. Even light physical activity can be beneficial.  Watch cholesterol and blood lipids  Have your blood tested for lipids and cholesterol at 55 years of age, then have this test every 5 years.  You may need to have your cholesterol levels checked more often if:  Your lipid or cholesterol levels are high.  You are older than 55 years of age.  You are at high risk for heart disease.  What should I know about cancer screening?  Many types of cancers can be detected early and may often be prevented. Depending on your health history and family history, you may need to have cancer screening at various ages. This may include screening for:  Colorectal cancer.  Prostate cancer.  Skin cancer.  Lung  cancer.  What should I know about heart disease, diabetes, and high blood pressure?  Blood pressure and heart disease  High blood pressure causes heart disease and increases the risk of stroke. This is more likely to develop in people who have high blood pressure readings or are overweight.  Talk with your health care provider about your target blood pressure readings.  Have your blood pressure checked:  Every 3-5 years if you are 24-52 years of age.  Every year if you are 3 years old or older.  If you are between the ages of 60 and 72 and are a current or former smoker, ask your health care provider if you should have a one-time screening for abdominal aortic aneurysm (AAA).  Diabetes  Have regular diabetes screenings. This checks your fasting blood sugar level. Have the screening done:  Once every three years after age 66 if you are at a normal weight and have a low risk for diabetes.  More often and at a younger age if you are overweight or have a high risk for diabetes.  What should I know about preventing infection?  Hepatitis B  If you have a higher risk for hepatitis B, you should be screened for this virus. Talk with your health care provider to find out if you are at risk for hepatitis B infection.  Hepatitis C  Blood testing is recommended for:  Everyone born from [birth year] through 1967.  Anyone  with known risk factors for hepatitis C.  Sexually transmitted infections (STIs)  You should be screened each year for STIs, including gonorrhea and chlamydia, if:  You are sexually active and are younger than 55 years of age.  You are older than 55 years of age and your health care provider tells you that you are at risk for this type of infection.  Your sexual activity has changed since you were last screened, and you are at increased risk for chlamydia or gonorrhea. Ask your health care provider if you are at risk.  Ask your health care provider about whether you are at high risk for HIV. Your health care provider  may recommend a prescription medicine to help prevent HIV infection. If you choose to take medicine to prevent HIV, you should first get tested for HIV. You should then be tested every 3 months for as long as you are taking the medicine.  Follow these instructions at home:  Alcohol use  Do not drink alcohol if your health care provider tells you not to drink.  If you drink alcohol:  Limit how much you have to 0-2 drinks a day.  Know how much alcohol is in your drink. In the [place] one drink equals one 12 oz bottle of beer (355 mL), one 5 oz glass of wine (148 mL), or one 1 oz glass of hard liquor (44 mL).  Lifestyle  Do not use any products that contain nicotine or tobacco. These products include cigarettes, chewing tobacco, and vaping devices, such as e-cigarettes. If you need help quitting, ask your health care provider.  Do not use street drugs.  Do not share needles.  Ask your health care provider for help if you need support or information about quitting drugs.  General instructions  Schedule regular health, dental, and eye exams.  Stay current with your vaccines.  Tell your health care provider if:  You often feel depressed.  You have ever been abused or do not feel safe at home.  Summary  Adopting a healthy lifestyle and getting preventive care are important in promoting health and wellness.  Follow your health care provider's instructions about healthy diet, exercising, and getting tested or screened for diseases.  Follow your health care provider's instructions on monitoring your cholesterol and blood pressure.  This information is not intended to replace advice given to you by your health care provider. Make sure you discuss any questions you have with your health care provider.  Document Revised: 02/27/2021 Document Reviewed: 02/27/2021  Elsevier Patient Education  2024 [employer].

## 2024-07-17 ENCOUNTER — Other Ambulatory Visit: Payer: Self-pay | Admitting: Family

## 2024-07-17 ENCOUNTER — Ambulatory Visit: Payer: Self-pay | Admitting: Family

## 2024-07-17 DIAGNOSIS — R748 Abnormal levels of other serum enzymes: Secondary | ICD-10-CM

## 2024-07-17 LAB — CMP14+EGFR
ALT: 58 IU/L — AB (ref 0–44)
AST: 50 IU/L — AB (ref 0–40)
Albumin: 4.5 g/dL (ref 3.8–4.9)
Alkaline Phosphatase: 58 IU/L (ref 47–123)
BUN/Creatinine Ratio: 14 (ref 9–20)
BUN: 17 mg/dL (ref 6–24)
Bilirubin Total: 0.4 mg/dL (ref 0.0–1.2)
CO2: 24 mmol/L (ref 20–29)
Calcium: 9.7 mg/dL (ref 8.7–10.2)
Chloride: 99 mmol/L (ref 96–106)
Creatinine, Ser: 1.18 mg/dL (ref 0.76–1.27)
Globulin, Total: 2.5 g/dL (ref 1.5–4.5)
Glucose: 92 mg/dL (ref 70–99)
Potassium: 4.5 mmol/L (ref 3.5–5.2)
Sodium: 137 mmol/L (ref 134–144)
Total Protein: 7 g/dL (ref 6.0–8.5)
eGFR: 73 mL/min/1.73 (ref >59)

## 2024-07-17 LAB — PSA, TOTAL AND FREE
PSA, Free Pct: 10 %
PSA, Free: 0.21 ng/mL
Prostate Specific Ag, Serum: 2.1 ng/mL (ref 0.0–4.0)

## 2024-07-17 LAB — CBC WITH DIFFERENTIAL/PLATELET
Basophils Absolute: 0 x10E3/uL (ref 0.0–0.2)
Basos: 1 %
EOS (ABSOLUTE): 0.1 x10E3/uL (ref 0.0–0.4)
Eos: 2 %
Hematocrit: 42.8 % (ref 37.5–51.0)
Hemoglobin: 14.2 g/dL (ref 13.0–17.7)
Immature Grans (Abs): 0 x10E3/uL (ref 0.0–0.1)
Immature Granulocytes: 0 %
Lymphocytes Absolute: 1.2 x10E3/uL (ref 0.7–3.1)
Lymphs: 24 %
MCH: 32 pg (ref 26.6–33.0)
MCHC: 33.2 g/dL (ref 31.5–35.7)
MCV: 96 fL (ref 79–97)
Monocytes Absolute: 0.6 x10E3/uL (ref 0.1–0.9)
Monocytes: 12 %
Neutrophils Absolute: 3.1 x10E3/uL (ref 1.4–7.0)
Neutrophils: 61 %
Platelets: 223 x10E3/uL (ref 150–450)
RBC: 4.44 x10E6/uL (ref 4.14–5.80)
RDW: 12.6 % (ref 11.6–15.4)
WBC: 5 x10E3/uL (ref 3.4–10.8)

## 2024-07-17 LAB — LIPID PANEL
Cholesterol, Total: 145 mg/dL (ref 100–199)
HDL: 97 mg/dL (ref >39)
LDL CALC COMMENT:: 1.5 ratio (ref 0.0–5.0)
LDL Chol Calc (NIH): 38 mg/dL (ref 0–99)
Triglycerides: 39 mg/dL (ref 0–149)
VLDL Cholesterol Cal: 10 mg/dL (ref 5–40)

## 2024-07-17 LAB — HEPATITIS B SURFACE ANTIBODY, QUANTITATIVE: Hepatitis B Surf Ab Quant: <3.5 m[IU]/mL — AB

## 2024-07-17 LAB — TSH: TSH: 0.986 u[IU]/mL (ref 0.450–4.500)

## 2024-07-27 ENCOUNTER — Ambulatory Visit (HOSPITAL_COMMUNITY)
Admission: RE | Admit: 2024-07-27 | Discharge: 2024-07-27 | Disposition: A | Discharge status: Home / Self Care | Source: Ambulatory Visit | Attending: Family | Admitting: Family

## 2024-07-27 ENCOUNTER — Ambulatory Visit: Payer: Self-pay | Admitting: Family

## 2024-07-27 DIAGNOSIS — R748 Abnormal levels of other serum enzymes: Secondary | ICD-10-CM | POA: Diagnosis present

## 2025-07-19 ENCOUNTER — Encounter: Payer: Self-pay | Admitting: Family
